# Patient Record
Sex: Female | Born: 1975 | Race: Black or African American | Hispanic: No | Marital: Single | Smoking: Former smoker
Health system: Southern US, Community
[De-identification: ages and names within clinical notes are randomized; demographics above are authoritative.]

## PROBLEM LIST (undated history)

## (undated) HISTORY — PX: OTHER SURGICAL HISTORY: SHX169

---

## 2007-01-09 ENCOUNTER — Emergency Department (HOSPITAL_COMMUNITY): Admission: EM | Admit: 2007-01-09 | Discharge: 2007-01-10 | Payer: Self-pay | Admitting: Emergency Medicine

## 2007-02-21 ENCOUNTER — Encounter (INDEPENDENT_AMBULATORY_CARE_PROVIDER_SITE_OTHER): Payer: Self-pay | Admitting: General Surgery

## 2007-02-21 ENCOUNTER — Ambulatory Visit (HOSPITAL_COMMUNITY): Admission: RE | Admit: 2007-02-21 | Discharge: 2007-02-21 | Payer: Self-pay | Admitting: General Surgery

## 2008-09-12 ENCOUNTER — Emergency Department (HOSPITAL_COMMUNITY): Admission: EM | Admit: 2008-09-12 | Discharge: 2008-09-12 | Payer: Self-pay | Admitting: Emergency Medicine

## 2008-11-27 ENCOUNTER — Emergency Department (HOSPITAL_COMMUNITY): Admission: EM | Admit: 2008-11-27 | Discharge: 2008-11-27 | Payer: Self-pay | Admitting: Emergency Medicine

## 2008-12-16 ENCOUNTER — Emergency Department (HOSPITAL_COMMUNITY): Admission: EM | Admit: 2008-12-16 | Discharge: 2008-12-16 | Payer: Self-pay | Admitting: Emergency Medicine

## 2009-11-22 ENCOUNTER — Emergency Department (HOSPITAL_COMMUNITY): Admission: EM | Admit: 2009-11-22 | Discharge: 2009-11-22 | Payer: Self-pay | Admitting: Emergency Medicine

## 2010-04-04 ENCOUNTER — Emergency Department (HOSPITAL_COMMUNITY)
Admission: EM | Admit: 2010-04-04 | Discharge: 2010-04-04 | Disposition: A | Discharge status: Home / Self Care | Payer: Self-pay | Attending: Emergency Medicine | Admitting: Emergency Medicine

## 2010-04-04 DIAGNOSIS — N949 Unspecified condition associated with female genital organs and menstrual cycle: Secondary | ICD-10-CM | POA: Insufficient documentation

## 2010-04-04 DIAGNOSIS — B9689 Other specified bacterial agents as the cause of diseases classified elsewhere: Secondary | ICD-10-CM | POA: Insufficient documentation

## 2010-04-04 DIAGNOSIS — N390 Urinary tract infection, site not specified: Secondary | ICD-10-CM | POA: Insufficient documentation

## 2010-04-04 DIAGNOSIS — A499 Bacterial infection, unspecified: Secondary | ICD-10-CM | POA: Insufficient documentation

## 2010-04-04 DIAGNOSIS — N760 Acute vaginitis: Secondary | ICD-10-CM | POA: Insufficient documentation

## 2010-04-04 LAB — POCT PREGNANCY, URINE: Preg Test, Ur: NEGATIVE

## 2010-04-04 LAB — URINALYSIS, ROUTINE W REFLEX MICROSCOPIC
Bilirubin Urine: NEGATIVE
Ketones, ur: NEGATIVE mg/dL
Nitrite: POSITIVE — AB
Protein, ur: NEGATIVE mg/dL
Urobilinogen, UA: 0.2 mg/dL (ref 0.0–1.0)

## 2010-04-04 LAB — WET PREP, GENITAL

## 2010-04-04 LAB — RPR: RPR Ser Ql: NONREACTIVE

## 2010-04-06 LAB — URINE CULTURE

## 2010-04-06 LAB — GC/CHLAMYDIA PROBE AMP, GENITAL
Chlamydia, DNA Probe: NEGATIVE
GC Probe Amp, Genital: NEGATIVE

## 2010-04-14 LAB — URINALYSIS, ROUTINE W REFLEX MICROSCOPIC
Protein, ur: 30 mg/dL — AB
Urobilinogen, UA: 0.2 mg/dL (ref 0.0–1.0)

## 2010-04-14 LAB — GC/CHLAMYDIA PROBE AMP, GENITAL: GC Probe Amp, Genital: NEGATIVE

## 2010-04-14 LAB — URINE MICROSCOPIC-ADD ON

## 2010-04-14 LAB — WET PREP, GENITAL
WBC, Wet Prep HPF POC: NONE SEEN
Yeast Wet Prep HPF POC: NONE SEEN

## 2010-05-25 NOTE — H&P (Signed)
NAME:  Jean Oneill, Jean Oneill Oneill NO.:  [id number]   MEDICAL RECORD NO.:  [id number]          PATIENT TYPE:  AMB   LOCATION:  DAY                           FACILITY:  [facility]   PHYSICIAN:  Tilford Pillar, MD      DATE OF BIRTH:  May 04, 1975   DATE OF ADMISSION:  DATE OF DISCHARGE:  LH                              HISTORY & PHYSICAL   CHIEF COMPLAINT:  Left shoulder pain.   HISTORY OF PRESENT ILLNESS:  The patient is a 35 year old female who has  had a large mass of the left shoulder and back for approximately 8  years.  This has been monitored with a slow increase in size.  Previously she had no discomfort, but as it has increased in size it is  also causing limitation of movement of her shoulder, with some  discomfort with movement and with her clothing.  She has denied any  erythema.  She denies any trauma to the area.  She has denied any weight  changes.  She has had no fever or chills.   PAST MEDICAL HISTORY:  Unremarkable.   PAST SURGICAL HISTORY:  Unremarkable.   MEDICATIONS:  None.   ALLERGIES:  No known drug allergies.   SOCIAL HISTORY:  She is a 5-6 cigarette per day smoker.  The patient  drinks alcohol mainly on weekends.  She denies any recreational drug  use.   REVIEW OF SYSTEMS:  CONSTITUTIONAL:  Occasional headaches.  EYES:  Occasional diplopia.  ENT:  Occasional rhinorrhea.  RESPIRATORY:  Occasional shortness of breath.  CARDIOVASCULAR:  Unremarkable.  GASTROINTESTINAL:  Occasional abdominal pain.  GENITOURINARY:  Unremarkable.  MUSCULOSKELETAL:  Arthralgias, mostly in this left  shoulder, as per HPI.  SKIN:  Occasional boils.  ENDOCRINE:  She  complains of excessive thirst on occasion.  NEUROLOGIC:  She denies any  decreased sensation.  She does have some occasional paresthesias.  She  also complains of intermittent episodes of dizziness.   PHYSICAL EXAMINATION:  VITAL SIGNS:  Reviewed.  GENERAL:  The patient is moderately obese, and she is not in any  acute  distress.  HEENT:  Scalp has no deformities, no masses.  Eyes:  Pupils equal,  round, and reactive.  Extraocular movements are intact.  No scleral  icterus or conjunctival pallor is noted.  No diminished hearing is  appreciated on exam.  Oral mucosa is pink, normal occlusion.  NECK:  Trachea is midline.  No cervical lymphadenopathy is appreciated.  PULMONARY:  Unlabored respirations.  She is clear to auscultation  bilaterally.  CARDIOVASCULAR:  Regular rate and rhythm.  No murmurs.  She has 2+  radial pulses bilaterally.  LYMPHATIC:  No axillary lymphadenopathy is appreciated.  ABDOMEN:  Positive bowel sounds.  Abdomen is soft, nontender.  SKIN:  Warm and dry.  She does have a large, approximately 10-12 cm soft  tissue mass involving her left shoulder and upper back.  This is soft,  mobile.  Does not feel to be attached to any of the underlying  musculature.  This is nontender to palpation, and no evidence of any  firm, irregular nodules within.   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  The risks,  benefits, and alternatives of excision were discussed with the patient.  Other possible etiologies were discussed with the patient, although  these are unlikely but would require additional surgeries should any  cancerous changes be identified within the lesion.  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  The  risks, including bleeding and infection and possible paresthesias, were  discussed with the patient.  She wishes to proceed.      Tilford Pillar, MD  Electronically Signed     BZ/MEDQ  D:  02/15/2007  T:  02/16/2007  Job:  [id number]   cc:   [facility]  Fax: [phone]

## 2010-05-25 NOTE — Op Note (Signed)
NAME:  Jean Oneill, LESMEISTER NO.:  [id number]   MEDICAL RECORD NO.:  [id number]          PATIENT TYPE:  AMB   LOCATION:  DAY                           FACILITY:  [facility]   PHYSICIAN:  Tilford Pillar, MD      DATE OF BIRTH:  01-Jul-1975   DATE OF PROCEDURE:  02/21/2007  DATE OF DISCHARGE:                               OPERATIVE REPORT   PREOPERATIVE DIAGNOSIS:  Left posterior shoulder soft tissue mass.   POSTOPERATIVE DIAGNOSIS:  Left posterior shoulder soft tissue mass.   PROCEDURE:  Excision of 15 x 20 cm lipomatous mass of the left posterior  shoulder.   SURGEON:  Tilford Pillar, M.D.   ANESTHESIA:  Laryngeal mask airway with local anesthetic.   SPECIMENS:  Lipomatous mass.   ESTIMATED BLOOD LOSS:  Minimal.   INDICATIONS FOR PROCEDURE:  The patient is a 35 year old African-  American female who presented to my office as an outpatient with a noted  mass on her left shoulder.  This had been present for approximately 10  years, had slowly increased in size, with now increasing difficulty with  movement of her left arm.  Otherwise, she had complained of no pain, no  weight loss, or weight changes, no fevers or chills, no erythematous  changes of the overlying skin.  Risks, benefits, and alternatives of  excision of the soft tissue mass suspected to be a benign lipoma was  discussed at length with the patient.  Other possibilities including  liposarcoma were discussed with the patient, although, this is very low  suspicion.  The patient's questions and concerns were addressed.  The  patient was consented for the planned procedure.   DESCRIPTION OF PROCEDURE:  The patient was taken to the operating room  and was placed in supine position on the operating table at which time  the sedation medication was administered.  At this point a large  __________  was placed and the patient was then repositioned into a  right lateral decubitus position with a beanbag for support.   Once in  position, her left shoulder was prepped and draped in the usual fashion.  A marking pen was utilized to mark the planned line of incision.  At  this point, the initial incision was made with the scalpel.  Additional  dissection down through the subcuticular tissue was carried out using  electrocautery.  A combination of electrocautery and blunt dissection  was utilized to dissect the soft tissue mass free.  This was noted to be  multilobulated, but clearly encapsulated and was relatively easy to free  from the surrounding tissue.  At this point hemostasis was obtained  using electrocautery.  At this point the soft tissue mass was delivered  through the incision and its remaining vascular pedicle was divided  using electrocautery.  This was then placed on the back table and was  sent as a permanent specimen to pathology.  Electrocautery was utilized  to obtain hemostasis in the resulting wound defect.  The wound was  irrigated with a copious amount of sterile saline.  The deep  subcuticular tissue  was then reapproximated using a 3-0 Vicryl in a  running continuous fashion.  The skin edges were reapproximated using a  4-0 Monocryl in a running subcuticular suture.  The skin was washed and  dried with a moist and dry towel.  Benzoin was applied over the incision  and half inch Steri-Strips were placed over the incisions. The drapes  were removed.  The patient was allowed to come out of general anesthesia  and was transferred to a regular hospital bed.  She was transferred to  the postanesthesia care unit in stable condition.  At the conclusion of  the procedure, all needle, sponge, and instrument counts correct.  The  patient tolerated the procedure well.      Tilford Pillar, MD  Electronically Signed     BZ/MEDQ  D:  02/21/2007  T:  02/22/2007  Job:  [id number]

## 2010-10-01 LAB — BASIC METABOLIC PANEL
CO2: 28
Calcium: 9.3
Creatinine, Ser: 0.84
GFR calc non Af Amer: >60
Glucose, Bld: 79
Sodium: 137

## 2010-10-01 LAB — CBC
Hemoglobin: 13.4
MCHC: 33.9
Platelets: 200
RDW: 14.3

## 2011-08-27 ENCOUNTER — Encounter (HOSPITAL_COMMUNITY): Payer: Self-pay | Admitting: *Deleted

## 2011-08-27 ENCOUNTER — Emergency Department (HOSPITAL_COMMUNITY): Payer: Self-pay

## 2011-08-27 ENCOUNTER — Emergency Department (HOSPITAL_COMMUNITY)
Admission: EM | Admit: 2011-08-27 | Discharge: 2011-08-27 | Disposition: A | Discharge status: Home / Self Care | Payer: Self-pay | Attending: Emergency Medicine | Admitting: Emergency Medicine

## 2011-08-27 DIAGNOSIS — N390 Urinary tract infection, site not specified: Secondary | ICD-10-CM | POA: Insufficient documentation

## 2011-08-27 DIAGNOSIS — R0789 Other chest pain: Secondary | ICD-10-CM

## 2011-08-27 DIAGNOSIS — R071 Chest pain on breathing: Secondary | ICD-10-CM | POA: Insufficient documentation

## 2011-08-27 DIAGNOSIS — R079 Chest pain, unspecified: Secondary | ICD-10-CM | POA: Insufficient documentation

## 2011-08-27 DIAGNOSIS — F17200 Nicotine dependence, unspecified, uncomplicated: Secondary | ICD-10-CM | POA: Insufficient documentation

## 2011-08-27 LAB — CBC
HCT: 41.1 % (ref 36.0–46.0)
MCHC: 33.8 g/dL (ref 30.0–36.0)
RDW: 13.2 % (ref 11.5–15.5)

## 2011-08-27 LAB — URINALYSIS, ROUTINE W REFLEX MICROSCOPIC
Ketones, ur: NEGATIVE mg/dL
Protein, ur: NEGATIVE mg/dL
Urobilinogen, UA: 0.2 mg/dL (ref 0.0–1.0)

## 2011-08-27 LAB — BASIC METABOLIC PANEL
BUN: 8 mg/dL (ref 6–23)
Creatinine, Ser: 0.92 mg/dL (ref 0.50–1.10)
GFR calc Af Amer: >90 mL/min (ref >90)
GFR calc non Af Amer: 80 mL/min — ABNORMAL LOW (ref >90)

## 2011-08-27 LAB — URINE MICROSCOPIC-ADD ON

## 2011-08-27 LAB — TROPONIN I: Troponin I: <0.3 ng/mL (ref <0.30)

## 2011-08-27 LAB — PREGNANCY, URINE: Preg Test, Ur: NEGATIVE

## 2011-08-27 MED ORDER — IBUPROFEN 400 MG PO TABS
400.0000 mg | ORAL_TABLET | Freq: Once | ORAL | Status: AC
  Administered 2011-08-27: 400 mg via ORAL
  Filled 2011-08-27: qty 1

## 2011-08-27 MED ORDER — HYDROCODONE-ACETAMINOPHEN 5-325 MG PO TABS: ORAL_TABLET | ORAL | Status: AC

## 2011-08-27 MED ORDER — NAPROXEN 250 MG PO TABS: 250.0000 mg | ORAL_TABLET | Freq: Two times a day (BID) | ORAL | Status: AC

## 2011-08-27 MED ORDER — CEPHALEXIN 500 MG PO CAPS: 500.0000 mg | ORAL_CAPSULE | Freq: Four times a day (QID) | ORAL | Status: AC

## 2011-08-27 MED ORDER — OXYCODONE-ACETAMINOPHEN 5-325 MG PO TABS
1.0000 | ORAL_TABLET | Freq: Once | ORAL | Status: AC
  Administered 2011-08-27: 1 via ORAL
  Filled 2011-08-27: qty 1

## 2011-08-27 NOTE — ED Notes (Signed)
Pt discharged. Pt stable at time of discharge. Medications reviewed pt has no questions regarding discharge at this time. Pt voiced understanding of discharge instructions.  

## 2011-08-27 NOTE — ED Notes (Signed)
Pt c/o left chest pain under her breast radiating to her back since yesterday am. Pt denies shortness of breath or dizziness. Pt denies nausea or vomiting.

## 2011-08-27 NOTE — ED Provider Notes (Signed)
History  This chart was scribed for Laray Anger, DO by Bennett Scrape. This patient was seen in room [facility].  CSN: [id number]  Arrival date & time 08/27/11  1118   First MD Initiated Contact with Patient 08/27/11 1130      Chief Complaint  Patient presents with  . Chest Pain     The history is provided by the patient. No language interpreter was used.   Pt was seen at 11:45AM. Jean Oneill is a 36 y.o. female who presents to the Emergency Department complaining of gradual onset and persistence of constant left sided chest "pain" since yesterday.  Describes the pain as a "burning" sensation located under her left breast that radiates to her back. The pain is worse with movement and palpation of the area. She denies taking OTC medications at home to improve symptoms. She denies any recent injuries. She denies fever, denies palpitations, no SOB/cough, no abd pain, no N/V/D, no rash.     History reviewed. No pertinent past medical history.  History reviewed. No pertinent past surgical history.  History  Substance Use Topics  . Smoking status: Current Everyday Smoker -- 0.5 packs/day    Types: Cigarettes  . Smokeless tobacco: Not on file  . Alcohol Use: Yes    No OB history provided.  Review of Systems ROS: Statement: All systems negative except as marked or noted in the HPI; Constitutional: Negative for fever and chills. ; ; Eyes: Negative for eye pain, redness and discharge. ; ; ENMT: Negative for ear pain, hoarseness, nasal congestion, sinus pressure and sore throat. ; ; Cardiovascular: +CP.  Negative for palpitations, diaphoresis, dyspnea and peripheral edema. ; ; Respiratory: Negative for cough, wheezing and stridor. ; ; Gastrointestinal: Negative for nausea, vomiting, diarrhea, abdominal pain, blood in stool, hematemesis, jaundice and rectal bleeding. . ; ; Genitourinary: Negative for dysuria, flank pain and hematuria. ; ; Musculoskeletal: Negative for back pain  and neck pain. Negative for swelling and trauma.; ; Skin: Negative for pruritus, rash, abrasions, blisters, bruising and skin lesion.; ; Neuro: Negative for headache, lightheadedness and neck stiffness. Negative for weakness, altered level of consciousness , altered mental status, extremity weakness, paresthesias, involuntary movement, seizure and syncope.     Allergies  Review of patient's allergies indicates no known allergies.  Home Medications  No current outpatient prescriptions on file.  Ht 5\' 1"  (1.549 m)  Wt 180 lb (81.647 kg)  BMI 34.01 kg/m2  LMP 07/30/2011  Physical Exam 1150: Physical examination:  Nursing notes reviewed; Vital signs and O2 SAT reviewed;  Constitutional: Well developed, Well nourished, Well hydrated, In no acute distress; Head:  Normocephalic, atraumatic; Eyes: EOMI, PERRL, No scleral icterus; ENMT: Mouth and pharynx normal, Mucous membranes moist; Neck: Supple, Full range of motion, No lymphadenopathy; Cardiovascular: Regular rate and rhythm, No murmur, rub, or gallop; Respiratory: Breath sounds clear & equal bilaterally, No rales, rhonchi, wheezes.  Speaking full sentences with ease, Normal respiratory effort/excursion; Chest: +TTP left lower anterior-lateral chest wall.  No rash, no ecchymosis, no soft tissue crepitus. Movement normal; Abdomen: Soft, Nontender, Nondistended, Normal bowel sounds;; Extremities: Pulses normal, No tenderness, No edema, No calf edema or asymmetry.; Neuro: AA&Ox3, Major CN grossly intact.  Speech clear. No gross focal motor or sensory deficits in extremities.; Skin: Color normal, Warm, Dry.   ED Course  Procedures   MDM  MDM Reviewed: nursing note and vitals Interpretation: labs, x-ray and ECG    Date: 08/27/2011  Rate: 56  Rhythm: normal sinus  rhythm  QRS Axis: normal  Intervals: normal  ST/T Wave abnormalities: normal  Conduction Disutrbances:none  Narrative Interpretation:   Old EKG Reviewed: none available.  Results  for orders placed during the hospital encounter of 08/27/11  TROPONIN I      Component Value Range   Troponin I <0.30  <0.30 ng/mL  D-DIMER, QUANTITATIVE      Component Value Range   D-Dimer, Quant 0.27  0.00 - 0.48 ug/mL-FEU  BASIC METABOLIC PANEL      Component Value Range   Sodium 135  135 - 145 mEq/L   Potassium 4.0  3.5 - 5.1 mEq/L   Chloride 102  96 - 112 mEq/L   CO2 27  19 - 32 mEq/L   Glucose, Bld 87  70 - 99 mg/dL   BUN 8  6 - 23 mg/dL   Creatinine, Ser 8.29  0.50 - 1.10 mg/dL   Calcium 9.7  8.4 - 56.2 mg/dL   GFR calc non Af Amer 80 (*) >90 mL/min   GFR calc Af Amer >90  >90 mL/min  CBC      Component Value Range   WBC 11.5 (*) 4.0 - 10.5 K/uL   RBC 4.37  3.87 - 5.11 MIL/uL   Hemoglobin 13.9  12.0 - 15.0 g/dL   HCT 13.0  86.5 - 78.4 %   MCV 94.1  78.0 - 100.0 fL   MCH 31.8  26.0 - 34.0 pg   MCHC 33.8  30.0 - 36.0 g/dL   RDW 69.6  29.5 - 28.4 %   Platelets 193  150 - 400 K/uL  URINALYSIS, ROUTINE W REFLEX MICROSCOPIC      Component Value Range   Color, Urine YELLOW  YELLOW   APPearance HAZY (*) CLEAR   Specific Gravity, Urine >1.030 (*) 1.005 - 1.030   pH 5.5  5.0 - 8.0   Glucose, UA NEGATIVE  NEGATIVE mg/dL   Hgb urine dipstick SMALL (*) NEGATIVE   Bilirubin Urine NEGATIVE  NEGATIVE   Ketones, ur NEGATIVE  NEGATIVE mg/dL   Protein, ur NEGATIVE  NEGATIVE mg/dL   Urobilinogen, UA 0.2  0.0 - 1.0 mg/dL   Nitrite POSITIVE (*) NEGATIVE   Leukocytes, UA NEGATIVE  NEGATIVE  PREGNANCY, URINE      Component Value Range   Preg Test, Ur NEGATIVE  NEGATIVE  URINE MICROSCOPIC-ADD ON      Component Value Range   Squamous Epithelial / LPF FEW (*) RARE   WBC, UA 3-6  <3 WBC/hpf   RBC / HPF 0-2  <3 RBC/hpf   Bacteria, UA MANY (*) RARE   Dg Chest 2 View 08/27/2011  *RADIOLOGY REPORT*  Clinical Data: Chest pain.  Back pain for 2 days.  Smoker.  CHEST - 2 VIEW  Comparison: None  Findings: The lateral view is mildly oblique. Numerous leads and wires project over the chest.   Midline trachea.  Normal heart size and mediastinal contours. No pleural effusion or pneumothorax. Clear lungs.  IMPRESSION: Normal chest.  Mildly oblique lateral view.  Original Report Authenticated By: Consuello Bossier, M.D.    1330:  Doubt ACS for cause for symptoms given normal EKG and troponin after 2 days of constant symptoms.  Doubt PE with normal d-dimer and no hypoxia, no tachypnea or tachycardia.   Appears msk pain at this time.  Will tx symptomatically.  Wants to go home now.  +UTI, will tx.  Dx testing d/w pt and family.  Questions answered.  Verb understanding,  agreeable to d/c home with outpt f/u.      I personally performed the services described in this documentation, which was scribed in my presence. The recorded information has been reviewed and considered. Ricky Doan Allison Quarry, DO 08/30/11 [phone]

## 2013-08-12 ENCOUNTER — Encounter (HOSPITAL_COMMUNITY): Payer: Self-pay | Admitting: Emergency Medicine

## 2013-08-12 ENCOUNTER — Emergency Department (HOSPITAL_COMMUNITY)
Admission: EM | Admit: 2013-08-12 | Discharge: 2013-08-12 | Disposition: A | Discharge status: Home / Self Care | Payer: Medicaid Other | Attending: Emergency Medicine | Admitting: Emergency Medicine

## 2013-08-12 DIAGNOSIS — J029 Acute pharyngitis, unspecified: Secondary | ICD-10-CM | POA: Insufficient documentation

## 2013-08-12 DIAGNOSIS — J020 Streptococcal pharyngitis: Secondary | ICD-10-CM | POA: Diagnosis not present

## 2013-08-12 DIAGNOSIS — R109 Unspecified abdominal pain: Secondary | ICD-10-CM | POA: Insufficient documentation

## 2013-08-12 DIAGNOSIS — F17200 Nicotine dependence, unspecified, uncomplicated: Secondary | ICD-10-CM | POA: Diagnosis not present

## 2013-08-12 DIAGNOSIS — Z79899 Other long term (current) drug therapy: Secondary | ICD-10-CM | POA: Diagnosis not present

## 2013-08-12 LAB — RAPID STREP SCREEN (MED CTR MEBANE ONLY): Streptococcus, Group A Screen (Direct): POSITIVE — AB

## 2013-08-12 MED ORDER — IBUPROFEN 400 MG PO TABS
400.0000 mg | ORAL_TABLET | Freq: Once | ORAL | Status: AC
  Administered 2013-08-12: 400 mg via ORAL
  Filled 2013-08-12: qty 1

## 2013-08-12 MED ORDER — PENICILLIN G BENZATHINE 1200000 UNIT/2ML IM SUSP
1.2000 10*6.[IU] | Freq: Once | INTRAMUSCULAR | Status: AC
  Administered 2013-08-12: 1.2 10*6.[IU] via INTRAMUSCULAR
  Filled 2013-08-12: qty 2

## 2013-08-12 MED ORDER — ACETAMINOPHEN 325 MG PO TABS
650.0000 mg | ORAL_TABLET | Freq: Once | ORAL | Status: AC
  Administered 2013-08-12: 650 mg via ORAL
  Filled 2013-08-12: qty 2

## 2013-08-12 MED ORDER — HYDROCODONE-ACETAMINOPHEN 7.5-325 MG/15ML PO SOLN: 15.0000 mL | Freq: Four times a day (QID) | ORAL | Status: AC | PRN

## 2013-08-12 NOTE — ED Notes (Addendum)
Patient complaining of abdominal pain and sore throat x 3 days. States "I think I have strep throat."

## 2013-08-12 NOTE — ED Provider Notes (Signed)
CSN: [id number]     Arrival date & time 08/12/13  1934 History   First MD Initiated Contact with Patient 08/12/13 2015     Chief Complaint  Patient presents with  . Abdominal Pain  . Sore Throat      HPI Pt was seen at 2045.  Per pt, c/o gradual onset and persistence of constant sore throat and subjective fevers/chills for the past 3 days. Pt describes her symptoms as "like the last time I had strep throat." Denies objective fevers, no rash, no CP/SOB, no N/V/D, no abd pain.     History reviewed. No pertinent past medical history.  History reviewed. No pertinent past surgical history.  History  Substance Use Topics  . Smoking status: Current Every Day Smoker -- 0.50 packs/day    Types: Cigarettes  . Smokeless tobacco: Not on file  . Alcohol Use: Yes     Comment: ocassionally    Review of Systems ROS: Statement: All systems negative except as marked or noted in the HPI; Constitutional: Negative for fever and +chills. ; ; Eyes: Negative for eye pain, redness and discharge. ; ; ENMT: Negative for ear pain, hoarseness, nasal congestion, sinus pressure and +sore throat. ; ; Cardiovascular: Negative for chest pain, palpitations, diaphoresis, dyspnea and peripheral edema. ; ; Respiratory: Negative for cough, wheezing and stridor. ; ; Gastrointestinal: Negative for nausea, vomiting, diarrhea, abdominal pain, blood in stool, hematemesis, jaundice and rectal bleeding. . ; ; Genitourinary: Negative for dysuria, flank pain and hematuria. ; ; Musculoskeletal: Negative for back pain and neck pain. Negative for swelling and trauma.; ; Skin: Negative for pruritus, rash, abrasions, blisters, bruising and skin lesion.; ; Neuro: Negative for headache, lightheadedness and neck stiffness. Negative for weakness, altered level of consciousness , altered mental status, extremity weakness, paresthesias, involuntary movement, seizure and syncope.        Allergies  Review of patient's allergies indicates no  known allergies.  Home Medications   Prior to Admission medications   Medication Sig Start Date End Date Taking? Authorizing Provider  ibuprofen (ADVIL,MOTRIN) 200 MG tablet Take 200 mg by mouth every 6 (six) hours as needed for fever or moderate pain.   Yes Historical Provider, MD   BP 123/73  Pulse 98  Temp(Src) 99.8 F (37.7 C) (Oral)  Resp 16  Ht 5\' 2"  (1.575 m)  Wt 183 lb (83.008 kg)  BMI 33.46 kg/m2  SpO2 100%  LMP 07/28/2013 Physical Exam 2050: Physical examination:  Nursing notes reviewed; Vital signs and O2 SAT reviewed;  Constitutional: Well developed, Well nourished, Well hydrated, In no acute distress; Head:  Normocephalic, atraumatic; Eyes: EOMI, PERRL, No scleral icterus; ENMT: TM's clear bilat. +edemetous nasal turbinates bilat with clear rhinorrhea. Mouth and pharynx without lesions. +posterior pharynx erythematous with tonsillar exudates. No intra-oral edema. No submandibular or sublingual edema. No hoarse voice, no drooling, no stridor. No pain with manipulation of larynx. No trismus. Mouth and pharynx normal, Mucous membranes moist; Neck: Supple, Full range of motion, No lymphadenopathy; Cardiovascular: Regular rate and rhythm, No murmur, rub, or gallop; Respiratory: Breath sounds clear & equal bilaterally, No rales, rhonchi, wheezes.  Speaking full sentences with ease, Normal respiratory effort/excursion; Chest: Nontender, Movement normal; Abdomen: Soft, Nontender, Nondistended, Normal bowel sounds; Genitourinary: No CVA tenderness; Extremities: Pulses normal, No tenderness, No edema, No calf edema or asymmetry.; Neuro: AA&Ox3, Major CN grossly intact.  Speech clear. No gross focal motor or sensory deficits in extremities.; Skin: Color normal, Warm, Dry.   ED Course  Procedures     MDM  MDM Reviewed: previous chart, nursing note and vitals Interpretation: labs    Results for orders placed during the hospital encounter of 08/12/13  RAPID STREP SCREEN       Result Value Ref Range   Streptococcus, Group A Screen (Direct) POSITIVE (*) NEGATIVE     2055:  Will tx with IM PCN now. Dx and testing d/w pt.  Questions answered.  Verb understanding, agreeable to d/c home with outpt f/u.       Samuel JesterKathleen Zoria Rawlinson, DO 08/15/13 [phone]

## 2013-08-12 NOTE — Discharge Instructions (Signed)
°Emergency Department Resource Guide °1) Find a Doctor and Pay Out of Pocket °Although you won't have to find out who is covered by your insurance plan, it is a good idea to ask around and get recommendations. You will then need to call the office and see if the doctor you have chosen will accept you as a new patient and what types of options they offer for patients who are self-pay. Some doctors offer discounts or will set up payment plans for their patients who do not have insurance, but you will need to ask so you aren't surprised when you get to your appointment. ° °2) Contact Your Local Health Department °Not all health departments have doctors that can see patients for sick visits, but many do, so it is worth a call to see if yours does. If you don't know where your local health department is, you can check in your phone book. The CDC also has a tool to help you locate your state's health department, and many state websites also have listings of all of their local health departments. ° °3) Find a Walk-in Clinic °If your illness is not likely to be very severe or complicated, you may want to try a walk in clinic. These are popping up all over the country in pharmacies, drugstores, and shopping centers. They're usually staffed by nurse practitioners or physician assistants that have been trained to treat common illnesses and complaints. They're usually fairly quick and inexpensive. However, if you have serious medical issues or chronic medical problems, these are probably not your best option. ° °No Primary Care Doctor: °- Call Health Connect at  832-8000 - they can help you locate a primary care doctor that  accepts your insurance, provides certain services, etc. °- Physician Referral Service- 1-800-533-3463 ° °Chronic Pain Problems: °Organization         Address  Phone   Notes  °[facility]  (336) 297-2271 Patients need to be referred by their primary care doctor.  ° °Medication  Assistance: °Organization         Address  Phone   Notes  °[place] [facility] [address] °[place], [place] [address] (336) 641-8030 --Must be a resident of [place] °-- Must have NO insurance coverage whatsoever (no Medicaid/ Medicare, etc.) °-- The pt. MUST have a primary care doctor that directs their care regularly and follows them in the community °  °[facility]  (866) 331-1348   °[employer]  (888) 892-1162   ° °Agencies that provide inexpensive medical care: °Organization         Address  Phone   Notes  °[facility]  (336) 832-8035   °[facility]    (336) 832-7272   °[facility] [address] °[place], [place] [address] (336) 832-4777   °[facility] [address], °[place] (336) 271-4999   °[facility]    (336) 373-0678   °[facility]    (336) 272-1050   °[facility] ° [address], [place] Phone:  (336) 832-4444, Fax:  (336) 832-4440 Hours of Operation:  9 am - 6 pm, M-F.  Also accepts Medicaid/Medicare and self-pay.  °[facility] ° [address], [place] Phone: (336) 832-3150, Fax: (336) [phone]. Hours of Operation:  8:30 am - 5:30 pm, M-F.  Also accepts Medicaid and self-pay.  °[facility] [place] [address], [place] Phone: (336) 878-6027   °[facility] [address], [place], [place] (336)723-1848, Ext. 123 Mondays & Thursdays: 7-9 AM.  First 15 patients are seen on a first come, first serve basis. °  ° °Medicaid-accepting [place] Providers: ° °Organization         Address  Phone   Notes  °[facility] [address], [place] (336) 641-2100 Also accepts self-pay patients.  °[facility] [address], [place] ° (336) 856-9996   °[facility] [address], [place]  (336) 288-8857   °[facility] [address] [place] [address], [place] (336) 299-7000   °Veita Bland [address], [place]  ° (336) 373-1557 Only accepts [place] Access Medicaid patients after they have their name applied to their card.  ° °Self-Pay (no insurance) in [place]: ° °Organization         Address  Phone   Notes  °Sickle Cell Patients, [facility] [address], [place] (336) 832-1970   °[facility] [address], [place] (336) 832-4400   °[facility] ° [address], [place] (336) 992-4800   °[facility]/Dr. Osei-Bonsu ° [address] [place] [address], [place] or [address], [place] (336) 841-8500 Phone number for both [place] and [place] locations is the same.  °[facility] [address], [place] (336) 299-0000   °[facility] [address] [place] [address], [place] or [address] (336) 852-7530 °(336) 878-2260   °[facility] [address], [place] (336) 350-1642, phone; (336) 294-5005, fax Sees patients 1st and 3rd Saturday of every month.  Must not qualify for public or private insurance (i.e. Medicaid, Medicare, [place] Health Choice, Veterans' Benefits) • Household income should be no more than 200% of the poverty level •The clinic cannot treat you if you are pregnant or think you are pregnant • Sexually transmitted diseases are not treated at the clinic.  ° ° °Dental Care: °Organization         Address  Phone  Notes  °[place] [facility] [address], [place] (336) 641-6152 Accepts children up to age 21 who are enrolled in Medicaid or [place] Health Choice; pregnant women with a Medicaid card; and children who have applied for Medicaid or [place] Health Choice, but were declined, whose parents can pay a reduced fee at time of service.  °[place]  [facility] [place]  [address], [place] (336) 641-7733 Accepts children up to age 21 who are enrolled in Medicaid or [place] Health Choice; pregnant women with a Medicaid card; and children who have applied for Medicaid or [place] Health Choice, but were declined, whose parents can pay a reduced fee at time of service.  °[facility] ° [address], [place] (336) 641-4533 Patients are seen by appointment only. Walk-ins are not accepted. [facility] will see patients 18 years of age and older. °Monday - Tuesday (8am-5pm) °Most Wednesdays (8:30-5pm) °$30 per visit, cash only  °[facility] ° [address], [place] (336) 641-4533 Patients are seen by appointment only. Walk-ins are not accepted. [facility] will see patients 18 years of age and older. °One   Wednesday Evening (Monthly: Volunteer Based).  $30 per visit, cash only  °[facility]  (919) 537-3737 for adults; Children under age 4, call Graduate Pediatric Dentistry at ([phone]) [phone]. Children aged 4-14, please call (919) [phone] to request a pediatric application. ° Dental services are provided in all areas of dental care including fillings, crowns and bridges, complete and partial dentures, implants, gum treatment, root canals, and extractions. Preventive care is also provided. Treatment is provided to both adults and children. °Patients are selected via a lottery and there is often a waiting list. °  °[facility] [address], °[place] ° (336) 763-8833 [email or web address] °  °[facility] [address], [place], [place] (336)723-1848, Ext. 123 Second and Fourth Thursday of each month, opens at 6:30 AM; Clinic ends at 9 AM.  Patients are seen on a first-come first-served basis, and a limited number are seen during each clinic.  ° °[facility] ° [address], [place], [place] (336) 723-7904    Eligibility Requirements °You must have lived in [place], [place], or [place] counties for at least the last three months. °  You cannot be eligible for state or federal sponsored healthcare insurance, including Veterans Administration, Medicaid, or Medicare. °  You generally cannot be eligible for healthcare insurance through your employer.  °  How to apply: °Eligibility screenings are held every Tuesday and Wednesday afternoon from 1:00 pm until 4:00 pm. You do not need an appointment for the interview!  °[facility] [address], [place], [place] 336-631-2330   °[facility]  336-342-8273   °[facility]  336-703-3100   °[facility]  336-570-6415   ° °Behavioral Health Resources in the Community: °Intensive Outpatient Programs °Organization         Address  Phone  Notes  °[facility] [address], [place], [place] 336-878-6098   °[facility] [address], [place], [place] 336-832-9800   °[facility] [address], [place], [place] ° 336-882-2125   °[facility] [address],  °[place], [place] 1-800-853-5163 or 336-641-4981   °Substance Abuse Resources °Organization         Address  Phone  Notes  °[facility]  336-882-2125   °[facility]  336-784-9470   °[facility]  336-285-9073   °[facility]  336-845-3988   °Residential & Outpatient Substance Abuse Program  1-800-659-3381   °Psychological Services °Organization         Address  Phone  Notes  °[facility]  336- 832-9600   °[facility]  336- 378-7881   °[facility] [address], [place] 1-800-853-5163 or 336-641-4981   ° °Mobile Crisis Teams °Organization         Address  Phone  Notes  °[facility], Mobile Crisis Care Unit  1-877-626-1772   °Assertive °[facility] ° [address]  [place], [place] 336-834-9664   °Sharon DeEsch [address] °[place] 336-554-5454   ° °Self-Help/Support Groups °Organization         Address  Phone             Notes  °[facility]  - variety of support groups  336- 373-1402 Call for more information  °Narcotics Anonymous (NA), [facility] [address], °[place]  2 meetings at this location  ° °  Residential Treatment Programs Organization         Address  Phone  Notes  [facility] [address],    [place]  [phone]   [facility]  [address], [place] [phone], [place], [place] [phone]   [facility] [address], [place] [phone] Admissions: 8am-3pm M-F  [facility] [address]    [place], [place] [phone]   [facility] [address], [place], [place] [phone]   [facility] [address]  [place], [place] [phone]   [facility] Intensive Outpatient [address], [place], [place] [phone]   [facility] ([facility] [address]  [place], [place] [phone]) [phone] or [phone]   [facility] ([facility] [address] [place], [place] [phone] Accepts Medicaid  [facility] [address]  [place] [phone] Substance Abuse/Addiction Treatment   [facility]         Address  Phone  Notes  [facility]  [phone](888) (847) [phone]   Angie FavaJulie Brannon, PhD [address], [address] [place], [place]   [phone](336) [phone] or [phone](336) [phone]   [facility]   [address] [place], [place] [phone](336) [phone]   [facility] [address], [place], [place] [phone]) [phone] Insurance/Medicaid/sponsorship through [facility]  [facility] [address]                                    [place], [place] [phone]) [phone] Therapy/tele-psych/case    [facility] [address]   [place], [place] [phone]) [phone]    Dr. Lolly MustacheArfeen  [phone](336) [phone]   [facility]  [employer] [facility] 1) [address], [place] 2) [address], [place] 3)  [address], [place] [phone](336) [phone](336) [phone]  [phone](336) [phone]   [facility] Child Abuse Hotline 564-235-1861(336) (445)284-6104 or [phone](336) 6676956549 (After Hours)       Take the prescription as directed. Also you can take over the counter ibuprofen, as directed on packaging, as needed for discomfort.  Gargle with warm water several times per day to help with discomfort.  May also use over the counter sore throat pain medicines such as chloraseptic or sucrets, as directed on packaging, as needed for discomfort.  Call your regular medical doctor tomorrow to schedule a follow up appointment in the next 2 days.  Return to the Emergency Department immediately if worsening.

## 2014-05-26 ENCOUNTER — Emergency Department (HOSPITAL_COMMUNITY): Payer: Medicaid Other

## 2014-05-26 ENCOUNTER — Emergency Department (HOSPITAL_COMMUNITY)
Admission: EM | Admit: 2014-05-26 | Discharge: 2014-05-26 | Disposition: A | Discharge status: Home / Self Care | Payer: Medicaid Other | Attending: Emergency Medicine | Admitting: Emergency Medicine

## 2014-05-26 ENCOUNTER — Encounter (HOSPITAL_COMMUNITY): Payer: Self-pay | Admitting: Emergency Medicine

## 2014-05-26 DIAGNOSIS — J019 Acute sinusitis, unspecified: Secondary | ICD-10-CM | POA: Diagnosis not present

## 2014-05-26 DIAGNOSIS — H578 Other specified disorders of eye and adnexa: Secondary | ICD-10-CM | POA: Diagnosis not present

## 2014-05-26 DIAGNOSIS — Z720 Tobacco use: Secondary | ICD-10-CM | POA: Diagnosis not present

## 2014-05-26 DIAGNOSIS — R0981 Nasal congestion: Secondary | ICD-10-CM | POA: Diagnosis present

## 2014-05-26 MED ORDER — PSEUDOEPHEDRINE HCL 60 MG PO TABS: 60.0000 mg | ORAL_TABLET | Freq: Four times a day (QID) | ORAL | Status: DC | PRN

## 2014-05-26 MED ORDER — AMOXICILLIN 500 MG PO CAPS: 500.0000 mg | ORAL_CAPSULE | Freq: Three times a day (TID) | ORAL | Status: AC

## 2014-05-26 MED ORDER — ALBUTEROL SULFATE HFA 108 (90 BASE) MCG/ACT IN AERS: 1.0000 | INHALATION_SPRAY | Freq: Four times a day (QID) | RESPIRATORY_TRACT | Status: AC | PRN

## 2014-05-26 NOTE — ED Provider Notes (Signed)
CSN: [id number]     Arrival date & time 05/26/14  1559 History  This chart was scribed for Burgess AmorJulie Rashad Auld, PA-C, working with No att. providers found by Leona CarryG. Clay Sherrill, ED Scribe. The patient was seen in APRP/RP. The patient's care was started at 6:14 PM.     Chief Complaint  Patient presents with  . Nasal Congestion   HPI HPI Comments: Jean Oneill is a [age] y.o. female with a history of smoking who presents to the Emergency Department complaining of nasal congestion and rhinorrhea beginning five days ago.  Her congestion has been thick and yellow to green in coloration.  She denies epistaxis.  Patient reports an associated fever last night that peaked at 101. She also complains of an associated cough that is productive of yellow and green sputum.  Patient reports pressure around her left eye and states that she has experienced tearing from this eye.  She denies ear pain or SOB.  She reports that one of her family members experienced similar symptoms and was seen in the ED last week.    History reviewed. No pertinent past medical history. History reviewed. No pertinent past surgical history. History reviewed. No pertinent family history. History  Substance Use Topics  . Smoking status: Current Every Day Smoker -- 0.50 packs/day    Types: Cigars  . Smokeless tobacco: Not on file  . Alcohol Use: Yes     Comment: ocassionally   OB History    No data available     Review of Systems  Constitutional: Positive for fever.  HENT: Positive for congestion and rhinorrhea. Negative for ear pain.   Eyes: Positive for pain and discharge.  Respiratory: Positive for cough. Negative for shortness of breath.       Allergies  Review of patient's allergies indicates no known allergies.  Home Medications   Prior to Admission medications   Medication Sig Start Date End Date Taking? Authorizing Provider  albuterol (PROVENTIL HFA;VENTOLIN HFA) 108 (90 BASE) MCG/ACT inhaler Inhale 1-2 puffs into the  lungs every 6 (six) hours as needed for wheezing or shortness of breath. 05/26/14   Burgess AmorJulie Imunique Samad, PA-C  amoxicillin (AMOXIL) 500 MG capsule Take 1 capsule (500 mg total) by mouth 3 (three) times daily. 05/26/14 06/05/14  Burgess AmorJulie Treanna Dumler, PA-C  HYDROcodone-acetaminophen (HYCET) 7.5-325 mg/15 ml solution Take 15 mLs by mouth every 6 (six) hours as needed for moderate pain. 08/12/13 08/12/14  Samuel JesterKathleen McManus, DO  ibuprofen (ADVIL,MOTRIN) 200 MG tablet Take 200 mg by mouth every 6 (six) hours as needed for fever or moderate pain.    Historical Provider, MD  pseudoephedrine (SUDAFED) 60 MG tablet Take 1 tablet (60 mg total) by mouth every 6 (six) hours as needed for congestion. 05/26/14   Burgess AmorJulie Lydiann Bonifas, PA-C   Triage Vitals: BP 125/88 mmHg  Pulse 72  Temp(Src) 98.7 F (37.1 C) (Oral)  Resp 20  Ht 5\' 2"  (1.575 m)  Wt 179 lb (81.194 kg)  BMI 32.73 kg/m2  SpO2 100%  LMP 05/12/2014 Physical Exam  Constitutional: She is oriented to person, place, and time. She appears well-developed and well-nourished.  HENT:  Head: Normocephalic and atraumatic.  Right Ear: Tympanic membrane and ear canal normal.  Left Ear: Tympanic membrane and ear canal normal.  Nose: Mucosal edema and rhinorrhea present. No epistaxis. Left sinus exhibits frontal sinus tenderness.  Mouth/Throat: Uvula is midline, oropharynx is clear and moist and mucous membranes are normal. No oropharyngeal exudate, posterior oropharyngeal edema, posterior oropharyngeal erythema or  tonsillar abscesses.  Eyes: Conjunctivae are normal.  Cardiovascular: Normal rate and normal heart sounds.   Pulmonary/Chest: Effort normal. No respiratory distress. She has no decreased breath sounds. She has wheezes. She has no rhonchi. She has no rales.  Expiratory wheeze right mid lung field  Abdominal: Soft. There is no tenderness.  Musculoskeletal: Normal range of motion.  Neurological: She is alert and oriented to person, place, and time.  Skin: Skin is warm and dry. No rash  noted.  Psychiatric: She has a normal mood and affect.  Nursing note and vitals reviewed.   ED Course  Procedures (including critical care time) DIAGNOSTIC STUDIES: Oxygen Saturation is 100% on room air, normal by my interpretation.    COORDINATION OF CARE:    Labs Review Labs Reviewed - No data to display  Imaging Review Dg Chest 2 View  05/26/2014   CLINICAL DATA:  Nasal congestion. Productive cough. Symptoms for 5 days. Short of breath.  EXAM: CHEST  2 VIEW  COMPARISON:  08/27/2011.  FINDINGS: Cardiopericardial silhouette within normal limits. Mediastinal contours normal. Trachea midline. No airspace disease or effusion.  IMPRESSION: No active cardiopulmonary disease.   Electronically Signed   By: Andreas NewportGeoffrey  Lamke M.D.   On: 05/26/2014 18:37     EKG Interpretation None      MDM   Final diagnoses:  Acute sinusitis, recurrence not specified, unspecified location    Patients labs and/or radiological studies were reviewed and considered during the medical decision making and disposition process.  Results were also discussed with patient. Pt was placed on amoxil, sudafed for sinusitis, albuterol with spacer for cough and mild wheeze.  cxray reviewed and clear.  Hx and exam c/w probable viral uri with resultant sinusitis.  The patient appears reasonably screened and/or stabilized for discharge and I doubt any other medical condition or other [facility] requiring further screening, evaluation, or treatment in the ED at this time prior to discharge.   I personally performed the services described in this documentation, which was scribed in my presence. The recorded information has been reviewed and is accurate.    Burgess AmorJulie Joseandres Mazer, PA-C 05/27/14 1354  Nelva Nayobert Beaton, MD 05/29/14 [phone]

## 2014-05-26 NOTE — ED Notes (Signed)
Pt states that she has had nasal congestion, itchy, watery eyes, and felt like she had a fever.

## 2014-05-26 NOTE — Discharge Instructions (Signed)
Sinusitis °Sinusitis is redness, soreness, and puffiness (inflammation) of the air pockets in the bones of your face (sinuses). The redness, soreness, and puffiness can cause air and mucus to get trapped in your sinuses. This can allow germs to grow and cause an infection.  °HOME CARE  °· Drink enough fluids to keep your pee (urine) clear or pale yellow. °· Use a humidifier in your home. °· Run a hot shower to create steam in the bathroom. Sit in the bathroom with the door closed. Breathe in the steam 3-4 times a day. °· Put a warm, moist washcloth on your face 3-4 times a day, or as told by your doctor. °· Use salt water sprays (saline sprays) to wet the thick fluid in your nose. This can help the sinuses drain. °· Only take medicine as told by your doctor. °GET HELP RIGHT AWAY IF:  °· Your pain gets worse. °· You have very bad headaches. °· You are sick to your stomach (nauseous). °· You throw up (vomit). °· You are very sleepy (drowsy) all the time. °· Your face is puffy (swollen). °· Your vision changes. °· You have a stiff neck. °· You have trouble breathing. °MAKE SURE YOU:  °· Understand these instructions. °· Will watch your condition. °· Will get help right away if you are not doing well or get worse. °Document Released: 06/15/2007 Document Revised: 09/21/2011 Document Reviewed: 08/02/2011 °ExitCare® Patient Information ©2015 [employer]. This information is not intended to replace advice given to you by your health care provider. Make sure you discuss any questions you have with your health care provider. ° °

## 2015-11-23 ENCOUNTER — Emergency Department (HOSPITAL_COMMUNITY): Payer: Medicaid Other

## 2015-11-23 ENCOUNTER — Encounter (HOSPITAL_COMMUNITY): Payer: Self-pay | Admitting: Emergency Medicine

## 2015-11-23 ENCOUNTER — Emergency Department (HOSPITAL_COMMUNITY)
Admission: EM | Admit: 2015-11-23 | Discharge: 2015-11-23 | Disposition: A | Discharge status: Home / Self Care | Payer: Medicaid Other | Attending: Emergency Medicine | Admitting: Emergency Medicine

## 2015-11-23 DIAGNOSIS — Z792 Long term (current) use of antibiotics: Secondary | ICD-10-CM | POA: Insufficient documentation

## 2015-11-23 DIAGNOSIS — Z791 Long term (current) use of non-steroidal anti-inflammatories (NSAID): Secondary | ICD-10-CM | POA: Diagnosis not present

## 2015-11-23 DIAGNOSIS — R109 Unspecified abdominal pain: Secondary | ICD-10-CM | POA: Diagnosis present

## 2015-11-23 DIAGNOSIS — N390 Urinary tract infection, site not specified: Secondary | ICD-10-CM

## 2015-11-23 DIAGNOSIS — R10A0 Flank pain, unspecified side: Secondary | ICD-10-CM

## 2015-11-23 DIAGNOSIS — F17290 Nicotine dependence, other tobacco product, uncomplicated: Secondary | ICD-10-CM | POA: Insufficient documentation

## 2015-11-23 DIAGNOSIS — Z79899 Other long term (current) drug therapy: Secondary | ICD-10-CM | POA: Diagnosis not present

## 2015-11-23 LAB — CBC WITH DIFFERENTIAL/PLATELET
Basophils Absolute: 0 10*3/uL (ref 0.0–0.1)
Basophils Relative: 0 %
EOS ABS: 0.2 10*3/uL (ref 0.0–0.7)
Eosinophils Relative: 1 %
HEMATOCRIT: 40.6 % (ref 36.0–46.0)
HEMOGLOBIN: 13.9 g/dL (ref 12.0–15.0)
LYMPHS ABS: 4.6 10*3/uL — AB (ref 0.7–4.0)
Lymphocytes Relative: 38 %
MCH: 32.5 pg (ref 26.0–34.0)
MCHC: 34.2 g/dL (ref 30.0–36.0)
MCV: 94.9 fL (ref 78.0–100.0)
MONOS PCT: 7 %
Monocytes Absolute: 0.9 10*3/uL (ref 0.1–1.0)
NEUTROS ABS: 6.6 10*3/uL (ref 1.7–7.7)
NEUTROS PCT: 54 %
Platelets: 190 10*3/uL (ref 150–400)
RBC: 4.28 MIL/uL (ref 3.87–5.11)
RDW: 13.4 % (ref 11.5–15.5)
WBC: 12.3 10*3/uL — ABNORMAL HIGH (ref 4.0–10.5)

## 2015-11-23 LAB — URINE MICROSCOPIC-ADD ON

## 2015-11-23 LAB — COMPREHENSIVE METABOLIC PANEL
ALBUMIN: 4.2 g/dL (ref 3.5–5.0)
ALK PHOS: 68 U/L (ref 38–126)
ALT: 21 U/L (ref 14–54)
ANION GAP: 8 (ref 5–15)
AST: 16 U/L (ref 15–41)
BUN: 12 mg/dL (ref 6–20)
CALCIUM: 9.6 mg/dL (ref 8.9–10.3)
CO2: 27 mmol/L (ref 22–32)
Chloride: 106 mmol/L (ref 101–111)
Creatinine, Ser: 1.12 mg/dL — ABNORMAL HIGH (ref 0.44–1.00)
GFR calc Af Amer: >60 mL/min (ref >60)
GFR calc non Af Amer: >60 mL/min (ref >60)
GLUCOSE: 81 mg/dL (ref 65–99)
Potassium: 3.4 mmol/L — ABNORMAL LOW (ref 3.5–5.1)
SODIUM: 141 mmol/L (ref 135–145)
Total Bilirubin: 0.4 mg/dL (ref 0.3–1.2)
Total Protein: 8 g/dL (ref 6.5–8.1)

## 2015-11-23 LAB — URINALYSIS, ROUTINE W REFLEX MICROSCOPIC
BILIRUBIN URINE: NEGATIVE
Glucose, UA: NEGATIVE mg/dL
Ketones, ur: NEGATIVE mg/dL
Leukocytes, UA: NEGATIVE
Nitrite: POSITIVE — AB
PH: 6 (ref 5.0–8.0)
Protein, ur: 30 mg/dL — AB
SPECIFIC GRAVITY, URINE: 1.025 (ref 1.005–1.030)

## 2015-11-23 LAB — POC URINE PREG, ED: Preg Test, Ur: NEGATIVE

## 2015-11-23 LAB — LIPASE, BLOOD: Lipase: 24 U/L (ref 11–51)

## 2015-11-23 MED ORDER — ONDANSETRON HCL 4 MG/2ML IJ SOLN
4.0000 mg | Freq: Once | INTRAMUSCULAR | Status: AC
  Administered 2015-11-23: 4 mg via INTRAVENOUS
  Filled 2015-11-23: qty 2

## 2015-11-23 MED ORDER — CYCLOBENZAPRINE HCL 10 MG PO TABS: 10.0000 mg | ORAL_TABLET | Freq: Two times a day (BID) | ORAL | 0 refills | Status: DC | PRN

## 2015-11-23 MED ORDER — HYDROMORPHONE HCL 1 MG/ML IJ SOLN
0.5000 mg | INTRAMUSCULAR | Status: DC | PRN
  Administered 2015-11-23: 0.5 mg via INTRAVENOUS
  Filled 2015-11-23: qty 1

## 2015-11-23 MED ORDER — SODIUM CHLORIDE 0.9 % IV BOLUS (SEPSIS)
1000.0000 mL | Freq: Once | INTRAVENOUS | Status: AC
  Administered 2015-11-23: 1000 mL via INTRAVENOUS

## 2015-11-23 MED ORDER — SODIUM CHLORIDE 0.9 % IV SOLN
INTRAVENOUS | Status: DC
  Administered 2015-11-23: 20:00:00 via INTRAVENOUS

## 2015-11-23 MED ORDER — CEPHALEXIN 250 MG PO CAPS: 250.0000 mg | ORAL_CAPSULE | Freq: Four times a day (QID) | ORAL | 0 refills | Status: DC

## 2015-11-23 MED ORDER — CEPHALEXIN 500 MG PO CAPS
500.0000 mg | ORAL_CAPSULE | Freq: Once | ORAL | Status: AC
  Administered 2015-11-23: 500 mg via ORAL
  Filled 2015-11-23: qty 1

## 2015-11-23 MED ORDER — HYDROCODONE-ACETAMINOPHEN 5-325 MG PO TABS: 1.0000 | ORAL_TABLET | ORAL | 0 refills | Status: DC | PRN

## 2015-11-23 NOTE — ED Notes (Signed)
IV removed from right [facility] intact. Pt tolerated well. Skin clean and dry

## 2015-11-23 NOTE — ED Triage Notes (Signed)
Pt reports L flank pain with radiation into her R groin that started on Friday. Pt denies hematuria, frequency or burning. Pt denies n/v.

## 2015-11-23 NOTE — Discharge Instructions (Signed)
Follow up with a primary care doctor to be rechecked later this week, return for fever, worsening symptoms

## 2015-11-23 NOTE — ED Provider Notes (Signed)
[facility]-EMER[facility]Y DEPT Provider Note   CSN: [id number] Arrival date & time: 11/23/15  1717     History   Chief Complaint Chief Complaint  Patient presents with  . Flank Pain    HPI Jean Oneill is a 40 y.o. female.  HPI Patient presents to the emergency room with complaints of left-sided flank pain. Symptoms started the end of last week. Patient has noticed that movement and certain positions make it worse. She has been having difficulty lying on her left side because of the pain. Today the symptoms have become more severe and she is very uncomfortable. The pain is sharp severe in the left flank and radiates towards the front of her abdomen. The pain is bringing her to tears. She denies any trouble with nausea vomiting. No dysuria. No diarrhea or constipation. No fevers chills coughing or shortness of breath. History reviewed. No pertinent past medical history.  There are no active problems to display for this patient.   Past Surgical History:  Procedure Laterality Date  . lipoma removal      OB History    Gravida Para Term Preterm AB Living   6 4 4   1 4    SAB TAB Ectopic Multiple Live Births   1               Home Medications    Prior to Admission medications   Medication Sig Start Date End Date Taking? Authorizing Provider  albuterol (PROVENTIL HFA;VENTOLIN HFA) 108 (90 BASE) MCG/ACT inhaler Inhale 1-2 puffs into the lungs every 6 (six) hours as needed for wheezing or shortness of breath. 05/26/14  Yes Raynelle FanningJulie Idol, PA-C  amoxicillin (AMOXIL) 500 MG capsule Take 500 mg by mouth every 8 (eight) hours. 8 day course starting on 11/20/2015   Yes Historical Provider, MD  ibuprofen (ADVIL,MOTRIN) 800 MG tablet Take 800 mg by mouth every 8 (eight) hours as needed for mild pain or moderate pain.   Yes Historical Provider, MD  cephALEXin (KEFLEX) 250 MG capsule Take 1 capsule (250 mg total) by mouth 4 (four) times daily. 11/23/15   Linwood DibblesJon Ninfa Giannelli, MD  cyclobenzaprine (FLEXERIL) 10  MG tablet Take 1 tablet (10 mg total) by mouth 2 (two) times daily as needed for muscle spasms. 11/23/15   Linwood DibblesJon Eyonna Sandstrom, MD  HYDROcodone-acetaminophen (NORCO/VICODIN) 5-325 MG tablet Take 1 tablet by mouth every 4 (four) hours as needed. 11/23/15   Linwood DibblesJon Alisse Tuite, MD    Family History Family History  Problem Relation Age of Onset  . Cancer Father   . Cancer Mother   . Hypertension Mother   . Diabetes Mother     Social History Social History  Substance Use Topics  . Smoking status: Current Every Day Smoker    Packs/day: 0.50    Types: Cigars  . Smokeless tobacco: Never Used  . Alcohol use Yes     Comment: ocassionally     Allergies   Patient has no known allergies.   Review of Systems Review of Systems  All other systems reviewed and are negative.    Physical Exam Updated Vital Signs BP 109/67   Pulse 65   Temp 98.1 F (36.7 C) (Oral)   Resp 20   Ht 5\' 1"  (1.549 m)   Wt 81.2 kg   LMP 10/30/2015   SpO2 100%   BMI 33.82 kg/m   Physical Exam  Constitutional: She appears well-developed and well-nourished. No distress.  HENT:  Head: Normocephalic and atraumatic.  Right Ear: External ear  normal.  Left Ear: External ear normal.  Eyes: Conjunctivae are normal. Right eye exhibits no discharge. Left eye exhibits no discharge. No scleral icterus.  Neck: Neck supple. No tracheal deviation present.  Cardiovascular: Normal rate, regular rhythm and intact distal pulses.   Pulmonary/Chest: Effort normal and breath sounds normal. No stridor. No respiratory distress. She has no wheezes. She has no rales.  Abdominal: Soft. Bowel sounds are normal. She exhibits no distension and no mass. There is tenderness. There is CVA tenderness (Left-sided). There is no rigidity, no rebound and no guarding. No hernia.  Musculoskeletal: She exhibits no edema or tenderness.  Neurological: She is alert. She has normal strength. No cranial nerve deficit (no facial droop, extraocular movements intact,  no slurred speech) or sensory deficit. She exhibits normal muscle tone. She displays no seizure activity. Coordination normal.  Skin: Skin is warm and dry. No rash noted.  Psychiatric: She has a normal mood and affect.  Nursing note and vitals reviewed.    ED Treatments / Results  Labs (all labs ordered are listed, but only abnormal results are displayed) Labs Reviewed  COMPREHENSIVE METABOLIC PANEL - Abnormal; Notable for the following:       Result Value   Potassium 3.4 (*)    Creatinine, Ser 1.12 (*)    All other components within normal limits  CBC WITH DIFFERENTIAL/PLATELET - Abnormal; Notable for the following:    WBC 12.3 (*)    Lymphs Abs 4.6 (*)    All other components within normal limits  URINALYSIS, ROUTINE W REFLEX MICROSCOPIC (NOT AT [facility]) - Abnormal; Notable for the following:    APPearance HAZY (*)    Hgb urine dipstick SMALL (*)    Protein, ur 30 (*)    Nitrite POSITIVE (*)    All other components within normal limits  URINE MICROSCOPIC-ADD ON - Abnormal; Notable for the following:    Squamous Epithelial / LPF 6-30 (*)    Bacteria, UA MANY (*)    All other components within normal limits  LIPASE, BLOOD  POC URINE PREG, ED    EKG  EKG Interpretation None       Radiology Ct Abdomen Pelvis Wo Contrast  Result Date: 11/23/2015 CLINICAL DATA:  Left flank pain radiating into the left groin since 11/20/2015. EXAM: CT ABDOMEN AND PELVIS WITHOUT CONTRAST TECHNIQUE: Multidetector CT imaging of the abdomen and pelvis was performed following the standard protocol without IV contrast. COMPARISON:  None. FINDINGS: Lower chest: Clear. No pleural or pericardial effusion. Heart size is normal. Hepatobiliary: Negative. Pancreas: Negative. Spleen: Negative. Adrenals/Urinary Tract: Negative. No urinary tract stones or hydronephrosis. No focal lesion is identified. Stomach/Bowel: Stomach is within normal limits. Appendix appears normal. No evidence of bowel wall thickening,  distention, or inflammatory changes. Vascular/Lymphatic: Scattered iliac artery atherosclerotic calcifications are identified and worse on the right. Reproductive: Simple appearing right ovarian cyst measuring 2.9 cm in diameter is incidentally noted. Otherwise unremarkable. Other: None. Musculoskeletal: Negative. IMPRESSION: No acute abnormality or finding to explain the patient's symptoms. Negative for urinary tract stone. Atherosclerosis. Electronically Signed   By: Drusilla Kannerhomas  Dalessio M.D.   On: 11/23/2015 20:38    Procedures Procedures (including critical care time)  Medications Ordered in ED Medications  sodium chloride 0.9 % bolus 1,000 mL (0 mLs Intravenous Stopped 11/23/15 2001)    And  0.9 %  sodium chloride infusion ( Intravenous New Bag/Given 11/23/15 2001)  HYDROmorphone (DILAUDID) injection 0.5 mg (0.5 mg Intravenous Given 11/23/15 1834)  cephALEXin (KEFLEX) capsule  500 mg (not administered)  ondansetron (ZOFRAN) injection 4 mg (4 mg Intravenous Given 11/23/15 1834)     Initial Impression / Assessment and Plan / ED Course  I have reviewed the triage vital signs and the nursing notes.  Pertinent labs & imaging results that were available during my care of the patient were reviewed by me and considered in my medical decision making (see chart for details).  Clinical Course    Labs show increased wbc and possible uti although no urinary symptoms.  CT scan without acute findings.   I suspect her pain is most likely musculoskeletal but I cannot completely exclude pyelonephritis because of the abnormal urinalysis I will start her on a course of oral antibiotics.  I discussed close outpatient follow-up with a primary care doctor. Return as needed for worsening symptoms, fever, vomiting  Final Clinical Impressions(s) / ED Diagnoses   Final diagnoses:  Flank pain  Urinary tract infection without hematuria, site unspecified    New Prescriptions New Prescriptions   CEPHALEXIN  (KEFLEX) 250 MG CAPSULE    Take 1 capsule (250 mg total) by mouth 4 (four) times daily.   CYCLOBENZAPRINE (FLEXERIL) 10 MG TABLET    Take 1 tablet (10 mg total) by mouth 2 (two) times daily as needed for muscle spasms.   HYDROCODONE-ACETAMINOPHEN (NORCO/VICODIN) 5-325 MG TABLET    Take 1 tablet by mouth every 4 (four) hours as needed.     Linwood Dibbles, MD 11/23/15 2124

## 2015-11-23 NOTE — ED Notes (Signed)
Pt reports malodorous urine X2 months, flank pain started Friday. Denies N/V/D, hematuria, or dysuria.

## 2015-12-26 ENCOUNTER — Emergency Department (HOSPITAL_COMMUNITY)
Admission: EM | Admit: 2015-12-26 | Discharge: 2015-12-26 | Disposition: A | Discharge status: Home / Self Care | Payer: Medicaid Other | Attending: Emergency Medicine | Admitting: Emergency Medicine

## 2015-12-26 ENCOUNTER — Encounter (HOSPITAL_COMMUNITY): Payer: Self-pay | Admitting: Emergency Medicine

## 2015-12-26 DIAGNOSIS — R197 Diarrhea, unspecified: Secondary | ICD-10-CM | POA: Diagnosis not present

## 2015-12-26 DIAGNOSIS — J3489 Other specified disorders of nose and nasal sinuses: Secondary | ICD-10-CM | POA: Insufficient documentation

## 2015-12-26 DIAGNOSIS — R51 Headache: Secondary | ICD-10-CM | POA: Diagnosis not present

## 2015-12-26 DIAGNOSIS — N390 Urinary tract infection, site not specified: Secondary | ICD-10-CM | POA: Diagnosis not present

## 2015-12-26 DIAGNOSIS — F17290 Nicotine dependence, other tobacco product, uncomplicated: Secondary | ICD-10-CM | POA: Insufficient documentation

## 2015-12-26 DIAGNOSIS — Z791 Long term (current) use of non-steroidal anti-inflammatories (NSAID): Secondary | ICD-10-CM | POA: Insufficient documentation

## 2015-12-26 DIAGNOSIS — R112 Nausea with vomiting, unspecified: Secondary | ICD-10-CM

## 2015-12-26 LAB — URINALYSIS, ROUTINE W REFLEX MICROSCOPIC
BILIRUBIN URINE: NEGATIVE
Glucose, UA: NEGATIVE mg/dL
Ketones, ur: 20 mg/dL — AB
LEUKOCYTES UA: NEGATIVE
NITRITE: POSITIVE — AB
PROTEIN: 30 mg/dL — AB
Specific Gravity, Urine: 1.027 (ref 1.005–1.030)
pH: 5 (ref 5.0–8.0)

## 2015-12-26 LAB — COMPREHENSIVE METABOLIC PANEL
ALK PHOS: 85 U/L (ref 38–126)
ALT: 34 U/L (ref 14–54)
ANION GAP: 9 (ref 5–15)
AST: 19 U/L (ref 15–41)
Albumin: 4.4 g/dL (ref 3.5–5.0)
BILIRUBIN TOTAL: 0.5 mg/dL (ref 0.3–1.2)
BUN: 10 mg/dL (ref 6–20)
CO2: 26 mmol/L (ref 22–32)
Calcium: 9.5 mg/dL (ref 8.9–10.3)
Chloride: 103 mmol/L (ref 101–111)
Creatinine, Ser: 0.83 mg/dL (ref 0.44–1.00)
GFR calc non Af Amer: >60 mL/min (ref >60)
Glucose, Bld: 87 mg/dL (ref 65–99)
Potassium: 3.6 mmol/L (ref 3.5–5.1)
SODIUM: 138 mmol/L (ref 135–145)
TOTAL PROTEIN: 8.5 g/dL — AB (ref 6.5–8.1)

## 2015-12-26 LAB — CBC
HCT: 44.8 % (ref 36.0–46.0)
HEMOGLOBIN: 14.9 g/dL (ref 12.0–15.0)
MCH: 31.8 pg (ref 26.0–34.0)
MCHC: 33.3 g/dL (ref 30.0–36.0)
MCV: 95.7 fL (ref 78.0–100.0)
Platelets: 194 10*3/uL (ref 150–400)
RBC: 4.68 MIL/uL (ref 3.87–5.11)
RDW: 13.3 % (ref 11.5–15.5)
WBC: 9.8 10*3/uL (ref 4.0–10.5)

## 2015-12-26 LAB — LIPASE, BLOOD: Lipase: 22 U/L (ref 11–51)

## 2015-12-26 LAB — PREGNANCY, URINE: Preg Test, Ur: NEGATIVE

## 2015-12-26 MED ORDER — ONDANSETRON HCL 4 MG PO TABS: 4.0000 mg | ORAL_TABLET | Freq: Three times a day (TID) | ORAL | 0 refills | Status: AC | PRN

## 2015-12-26 MED ORDER — SODIUM CHLORIDE 0.9 % IV BOLUS (SEPSIS)
1000.0000 mL | Freq: Once | INTRAVENOUS | Status: AC
  Administered 2015-12-26: 1000 mL via INTRAVENOUS

## 2015-12-26 MED ORDER — NITROFURANTOIN MONOHYD MACRO 100 MG PO CAPS: 100.0000 mg | ORAL_CAPSULE | Freq: Two times a day (BID) | ORAL | 0 refills | Status: DC

## 2015-12-26 MED ORDER — ONDANSETRON HCL 4 MG/2ML IJ SOLN
4.0000 mg | INTRAMUSCULAR | Status: DC | PRN
  Administered 2015-12-26: 4 mg via INTRAVENOUS
  Filled 2015-12-26: qty 2

## 2015-12-26 NOTE — ED Notes (Signed)
Pt has consumed ginger ale and did not have any emesis or nausea.  Urine specimen obtained.

## 2015-12-26 NOTE — ED Notes (Signed)
Pt states understanding of care given and follow up instructions.  Ambulated from ED with SO

## 2015-12-26 NOTE — ED Triage Notes (Signed)
Pt reports /n/v/d and headache that started yesterday.

## 2015-12-26 NOTE — ED Notes (Signed)
Pt states that she was exposed to her boyfriend and Victory DakinRiley who have had the same symptoms of n/v/d.

## 2015-12-26 NOTE — ED Provider Notes (Signed)
[facility] DE[facility] Provider Note   CSN: [id number] Arrival date & time: 12/26/15  1409     History   Chief Complaint Chief Complaint  Patient presents with  . Emesis    HPI Jean Oneill is a 40 y.o. female.   Emesis    Pt was seen at 1720. Per pt, c/o gradual onset and persistence of multiple intermittent episodes of N/V/D that began overnight last night.   Describes the stools as "watery." Has been associated with frontal headache, sinus and nasal congestion. Multiple others in household with similar symptoms. Denies abd pain, no CP/SOB, no back pain, no fevers, no black or blood in stools or emesis.    History reviewed. No pertinent past medical history.  There are no active problems to display for this patient.   Past Surgical History:  Procedure Laterality Date  . lipoma removal      OB History    Gravida Para Term Preterm AB Living   6 4 4   1 4    SAB TAB Ectopic Multiple Live Births   1               Home Medications    Prior to Admission medications   Medication Sig Start Date End Date Taking? Authorizing Provider  albuterol (PROVENTIL HFA;VENTOLIN HFA) 108 (90 BASE) MCG/ACT inhaler Inhale 1-2 puffs into the lungs every 6 (six) hours as needed for wheezing or shortness of breath. 05/26/14   Burgess AmorJulie Idol, PA-C  amoxicillin (AMOXIL) 500 MG capsule Take 500 mg by mouth every 8 (eight) hours. 8 day course starting on 11/20/2015    Historical Provider, MD  cephALEXin (KEFLEX) 250 MG capsule Take 1 capsule (250 mg total) by mouth 4 (four) times daily. 11/23/15   Linwood DibblesJon Knapp, MD  cyclobenzaprine (FLEXERIL) 10 MG tablet Take 1 tablet (10 mg total) by mouth 2 (two) times daily as needed for muscle spasms. 11/23/15   Linwood DibblesJon Knapp, MD  HYDROcodone-acetaminophen (NORCO/VICODIN) 5-325 MG tablet Take 1 tablet by mouth every 4 (four) hours as needed. 11/23/15   Linwood DibblesJon Knapp, MD  ibuprofen (ADVIL,MOTRIN) 800 MG tablet Take 800 mg by mouth every 8 (eight) hours as needed for mild  pain or moderate pain.    Historical Provider, MD    Family History Family History  Problem Relation Age of Onset  . Cancer Father   . Cancer Mother   . Hypertension Mother   . Diabetes Mother     Social History Social History  Substance Use Topics  . Smoking status: Current Every Day Smoker    Packs/day: 0.50    Types: Cigars  . Smokeless tobacco: Never Used  . Alcohol use Yes     Comment: ocassionally     Allergies   Patient has no known allergies.   Review of Systems Review of Systems  Gastrointestinal: Positive for vomiting.  ROS: Statement: All systems negative except as marked or noted in the HPI; Constitutional: Negative for fever and chills. ; ; Eyes: Negative for eye pain, redness and discharge. ; ; ENMT: Negative for ear pain, hoarseness, sore throat. +frontal headache, nasal congestion, sinus pressure. ; ; Cardiovascular: Negative for chest pain, palpitations, diaphoresis, dyspnea and peripheral edema. ; ; Respiratory: Negative for cough, wheezing and stridor. ; ; Gastrointestinal: +N/V/D. Negative for abdominal pain, blood in stool, hematemesis, jaundice and rectal bleeding. . ; ; Genitourinary: Negative for dysuria, flank pain and hematuria. ; ; Musculoskeletal: Negative for back pain and neck pain. Negative for swelling and  trauma.; ; Skin: Negative for pruritus, rash, abrasions, blisters, bruising and skin lesion.; ; Neuro: Negative for lightheadedness and neck stiffness. Negative for weakness, altered level of consciousness, altered mental status, extremity weakness, paresthesias, involuntary movement, seizure and syncope.      Physical Exam Updated Vital Signs BP 108/78 (BP Location: Right Leg)   Pulse 69   Temp 98.7 F (37.1 C) (Oral)   Resp 18   Ht 5' (1.524 m)   Wt 179 lb (81.2 kg)   LMP 12/22/2015   SpO2 100%   BMI 34.96 kg/m   Physical Exam 1725; Physical examination:  Nursing notes reviewed; Vital signs and O2 SAT reviewed;  Constitutional:  Well developed, Well nourished, Well hydrated, In no acute distress; Head:  Normocephalic, atraumatic; Eyes: EOMI, PERRL, No scleral icterus; ENMT: TM's clear bilat. +edemetous nasal turbinates bilat with clear rhinorrhea. Mouth and pharynx without lesions. No tonsillar exudates. No intra-oral edema. No submandibular or sublingual edema. No hoarse voice, no drooling, no stridor. No pain with manipulation of larynx. No trismus. Mouth and pharynx normal, Mucous membranes moist; Neck: Supple, Full range of motion, No lymphadenopathy; Cardiovascular: Regular rate and rhythm, No murmur, rub, or gallop; Respiratory: Breath sounds clear & equal bilaterally, No rales, rhonchi, wheezes.  Speaking full sentences with ease, Normal respiratory effort/excursion; Chest: Nontender, Movement normal; Abdomen: Soft, Nontender, Nondistended, Normal bowel sounds; Genitourinary: No CVA tenderness; Extremities: Pulses normal, No tenderness, No edema, No calf edema or asymmetry.; Neuro: AA&Ox3, Major CN grossly intact.  Speech clear. No gross focal motor or sensory deficits in extremities.; Skin: Color normal, Warm, Dry.   ED Treatments / Results  Labs (all labs ordered are listed, but only abnormal results are displayed)   EKG  EKG Interpretation None       Radiology   Procedures Procedures (including critical care time)  Medications Ordered in ED Medications  ondansetron (ZOFRAN) injection 4 mg (4 mg Intravenous Given 12/26/15 1743)  sodium chloride 0.9 % bolus 1,000 mL (1,000 mLs Intravenous New Bag/Given 12/26/15 1743)     Initial Impression / Assessment and Plan / ED Course  I have reviewed the triage vital signs and the nursing notes.  Pertinent labs & imaging results that were available during my care of the patient were reviewed by me and considered in my medical decision making (see chart for details).  MDM Reviewed: previous chart, nursing note and vitals Reviewed previous:  labs Interpretation: labs   Results for orders placed or performed during the hospital encounter of 12/26/15  Lipase, blood  Result Value Ref Range   Lipase 22 11 - 51 U/L  Comprehensive metabolic panel  Result Value Ref Range   Sodium 138 135 - 145 mmol/L   Potassium 3.6 3.5 - 5.1 mmol/L   Chloride 103 101 - 111 mmol/L   CO2 26 22 - 32 mmol/L   Glucose, Bld 87 65 - 99 mg/dL   BUN 10 6 - 20 mg/dL   Creatinine, Ser 2.440.83 0.44 - 1.00 mg/dL   Calcium 9.5 8.9 - 01.010.3 mg/dL   Total Protein 8.5 (H) 6.5 - 8.1 g/dL   Albumin 4.4 3.5 - 5.0 g/dL   AST 19 15 - 41 U/L   ALT 34 14 - 54 U/L   Alkaline Phosphatase 85 38 - 126 U/L   Total Bilirubin 0.5 0.3 - 1.2 mg/dL   GFR calc non Af Amer >60 >60 mL/min   GFR calc Af Amer >60 >60 mL/min   Anion gap 9 5 -  15  CBC  Result Value Ref Range   WBC 9.8 4.0 - 10.5 K/uL   RBC 4.68 3.87 - 5.11 MIL/uL   Hemoglobin 14.9 12.0 - 15.0 g/dL   HCT 40.9 81.1 - 91.4 %   MCV 95.7 78.0 - 100.0 fL   MCH 31.8 26.0 - 34.0 pg   MCHC 33.3 30.0 - 36.0 g/dL   RDW 78.2 95.6 - 21.3 %   Platelets 194 150 - 400 K/uL  Urinalysis, Routine w reflex microscopic  Result Value Ref Range   Color, Urine AMBER (A) YELLOW   APPearance HAZY (A) CLEAR   Specific Gravity, Urine 1.027 1.005 - 1.030   pH 5.0 5.0 - 8.0   Glucose, UA NEGATIVE NEGATIVE mg/dL   Hgb urine dipstick MODERATE (A) NEGATIVE   Bilirubin Urine NEGATIVE NEGATIVE   Ketones, ur 20 (A) NEGATIVE mg/dL   Protein, ur 30 (A) NEGATIVE mg/dL   Nitrite POSITIVE (A) NEGATIVE   Leukocytes, UA NEGATIVE NEGATIVE   RBC / HPF 6-30 0 - 5 RBC/hpf   WBC, UA 0-5 0 - 5 WBC/hpf   Bacteria, UA MANY (A) NONE SEEN   Mucous PRESENT   Pregnancy, urine  Result Value Ref Range   Preg Test, Ur NEGATIVE NEGATIVE    1915:  Pt has tol PO well while in the ED without N/V.  No stooling while in the ED.  Abd remains benign, VSS. Feels better and wants to go home now. Pt states she was tx for UTI last month (keflex). Will obtain UC  today. Dx and testing d/w pt.  Questions answered.  Verb understanding, agreeable to d/c home with outpt f/u.     Final Clinical Impressions(s) / ED Diagnoses   Final diagnoses:  None    New Prescriptions New Prescriptions   No medications on file      Samuel Jester, DO 12/30/15 1550

## 2015-12-26 NOTE — Discharge Instructions (Signed)
Take the prescriptions as directed.  Increase your fluid intake (ie:  Gatoraide) for the next few days, as discussed.  Eat a bland diet and advance to your regular diet slowly as you can tolerate it.   Avoid full strength juices, as well as milk and milk products until your diarrhea has resolved.  Call your regular medical doctor Monday to schedule a follow up appointment this week.  Return to the Emergency Department immediately if not improving (or even worsening) despite taking the medicines as prescribed, any black or bloody stool or vomit, or for any other concerns. ° °

## 2015-12-30 LAB — URINE CULTURE

## 2015-12-31 ENCOUNTER — Telehealth (HOSPITAL_BASED_OUTPATIENT_CLINIC_OR_DEPARTMENT_OTHER): Payer: Self-pay

## 2015-12-31 NOTE — Progress Notes (Signed)
ED Antimicrobial Stewardship Positive Culture Follow Up   Elwanda Brooklynonya D Pullara is an 40 y.o. female who [facility] with a chief complaint of  Chief Complaint  Patient presents with  . Emesis    Recent Results (from the past 720 hour(s))  Urine culture     Status: Abnormal   Collection Time: 12/26/15  6:40 PM  Result Value Ref Range Status   Specimen Description URINE, RANDOM  Final   Special Requests NONE  Final   Culture >=100,000 COLONIES/mL ESCHERICHIA COLI (A)  Final   Report Status 12/30/2015 FINAL  Final   Organism ID, Bacteria ESCHERICHIA COLI (A)  Final      Susceptibility   Escherichia coli - MIC*    AMPICILLIN >=32 RESISTANT Resistant     CEFAZOLIN <=4 SENSITIVE Sensitive     CEFTRIAXONE <=1 SENSITIVE Sensitive     CIPROFLOXACIN <=0.25 SENSITIVE Sensitive     GENTAMICIN 2 SENSITIVE Sensitive     IMIPENEM 0.5 SENSITIVE Sensitive     NITROFURANTOIN 64 INTERMEDIATE Intermediate     TRIMETH/SULFA <=20 SENSITIVE Sensitive     AMPICILLIN/SULBACTAM >=32 RESISTANT Resistant     PIP/TAZO <=4 SENSITIVE Sensitive     Extended ESBL NEGATIVE Sensitive     * >=100,000 COLONIES/mL ESCHERICHIA COLI    [x]  Treated with macrobid, organism intermediate to prescribed antimicrobial []  Patient discharged originally without antimicrobial agent and treatment is now indicated  New antibiotic prescription: keflex 250 mg bid x 1 week  ED Provider: Shanna CiscoJamie Ward, PA-C  Bertram MillardMichael A Cathleen Yagi 12/31/2015, 8:30 AM Infectious Diseases Pharmacist Phone# [phone]

## 2015-12-31 NOTE — Telephone Encounter (Signed)
Post ED Visit - Positive Culture Follow-up: Successful Patient Follow-Up  Culture assessed and recommendations reviewed by: []  Enzo BiNathan Batchelder, Pharm.D. []  Celedonio MiyamotoJeremy Frens, Pharm.D., BCPS [x]  Garvin FilaMike Maccia, Pharm.D. []  Georgina PillionElizabeth Martin, Pharm.D., BCPS []  HonorMinh Pham, [address]Pharm[address]D., BCPS, AAHIVP []  Estella HuskMichelle Turner, Pharm.D., BCPS, AAHIVP []  Tennis Mustassie Stewart, Pharm.D. []  Rob Oswaldo DoneVincent, [address]Pharm[address]  Positive urine culture, >/= 100,000 colonies -> E Coli  []  Patient discharged without antimicrobial prescription and treatment is now indicated [x]  Organism is intermediate to prescribed ED discharge antimicrobial []  Patient with positive blood cultures  Changes discussed with ED provider: Elizabeth SauerJaime Ward PA New antibiotic prescription "D/C Macrobid, Keflex 250 mg BID x week" Called to   Contacted patient, date 12/31/15, time 1307  LVM requesting callback.  [facility]     Arvid RightClark, Aylyn Wenzler Dorn 12/31/2015, 1:01 PM

## 2016-01-09 ENCOUNTER — Telehealth: Payer: Self-pay

## 2016-01-09 NOTE — Telephone Encounter (Signed)
Post ED Visit - Positive Culture Follow-up: Successful Patient Follow-Up  Culture assessed and recommendations reviewed by: []  Enzo BiNathan Batchelder, Pharm.D. []  Celedonio MiyamotoJeremy Frens, Pharm.D., BCPS []  Garvin FilaMike Maccia, Pharm.D. []  Georgina PillionElizabeth Martin, Pharm.D., BCPS []  Pymatuning SouthMinh Pham, [address]Pharm.D., BCPS, AAHIVP []  Estella HuskMichelle Turner, Pharm.D., BCPS, AAHIVP []  Tennis Mustassie Stewart, Pharm.D. []  Sherle Poeob Vincent, [address]Ph[address]  Positive urine culture  []  Patient discharged without antimicrobial prescription and treatment is now indicated [x]  Organism is resistant to prescribed ED discharge antimicrobial []  Patient with positive blood cultures  Changes discussed with ED provider: [facility]ime Ward [facility] New [facility] prescription Keflex 250 mg BID x 1 week [facility]  [phone]  Contacted patient, date 01/09/16, time 1625   Jerry CarasCullom, Milliani Herrada Burnett 01/09/2016, 4:24 PM

## 2016-10-13 ENCOUNTER — Emergency Department (HOSPITAL_COMMUNITY)
Admission: EM | Admit: 2016-10-13 | Discharge: 2016-10-13 | Disposition: A | Discharge status: Home / Self Care | Payer: Self-pay | Attending: Emergency Medicine | Admitting: Emergency Medicine

## 2016-10-13 ENCOUNTER — Encounter (HOSPITAL_COMMUNITY): Payer: Self-pay

## 2016-10-13 DIAGNOSIS — F17290 Nicotine dependence, other tobacco product, uncomplicated: Secondary | ICD-10-CM | POA: Insufficient documentation

## 2016-10-13 DIAGNOSIS — N760 Acute vaginitis: Secondary | ICD-10-CM | POA: Insufficient documentation

## 2016-10-13 DIAGNOSIS — Z79899 Other long term (current) drug therapy: Secondary | ICD-10-CM | POA: Insufficient documentation

## 2016-10-13 DIAGNOSIS — B9689 Other specified bacterial agents as the cause of diseases classified elsewhere: Secondary | ICD-10-CM | POA: Insufficient documentation

## 2016-10-13 LAB — URINALYSIS, ROUTINE W REFLEX MICROSCOPIC
BILIRUBIN URINE: NEGATIVE
GLUCOSE, UA: NEGATIVE mg/dL
Ketones, ur: 20 mg/dL — AB
Leukocytes, UA: NEGATIVE
NITRITE: POSITIVE — AB
PH: 5 (ref 5.0–8.0)
Protein, ur: 30 mg/dL — AB
SPECIFIC GRAVITY, URINE: 1.027 (ref 1.005–1.030)

## 2016-10-13 LAB — WET PREP, GENITAL
SPERM: NONE SEEN
TRICH WET PREP: NONE SEEN
YEAST WET PREP: NONE SEEN

## 2016-10-13 LAB — PREGNANCY, URINE: Preg Test, Ur: NEGATIVE

## 2016-10-13 MED ORDER — METRONIDAZOLE 500 MG PO TABS: 500.0000 mg | ORAL_TABLET | Freq: Two times a day (BID) | ORAL | 0 refills | Status: DC

## 2016-10-13 NOTE — Discharge Instructions (Signed)
Flagyl as prescribed.  We will call you if your cultures indicate your require further treatment or action.

## 2016-10-13 NOTE — ED Triage Notes (Signed)
Pt reports left flank pain and white vaginal discharge x 1 month, seen and treated here for same a month ago.

## 2016-10-13 NOTE — ED Provider Notes (Signed)
[facility]-EMERGENCY DEPT Provider Note   CSN: [id number] Arrival date & time: 10/13/16  0508     History   Chief Complaint Chief Complaint  Patient presents with  . Abdominal Pain    HPI Jean Oneill is a 41 y.o. female.  Patient is a 41 year old female who presents with complaints of vaginal discharge and lower abdominal cramping for the past month. She denies any fevers or chills. No urinary complaints. She is sexually active with the same partner for the past 2 years. Last menstrual period was this month, and she actually reports having "two periods". No bowel or bladder complaints.   The history is provided by the patient.  Abdominal Pain   This is a new problem. Episode onset:  one month ago. The problem occurs constantly. The problem has been gradually worsening. The pain is located in the suprapubic region. The quality of the pain is cramping. The pain is moderate. Pertinent negatives include fever. Nothing aggravates the symptoms. Nothing relieves the symptoms.    History reviewed. No pertinent past medical history.  There are no active problems to display for this patient.   Past Surgical History:  Procedure Laterality Date  . lipoma removal      OB History    Gravida Para Term Preterm AB Living   SAB TAB Ectopic Multiple Live Births   1               Home Medications    Prior to Admission medications   Medication Sig Start Date End Date Taking? Authorizing Provider  albuterol (PROVENTIL HFA;VENTOLIN HFA) 108 (90 BASE) MCG/ACT inhaler Inhale 1-2 puffs into the lungs every 6 (six) hours as needed for wheezing or shortness of breath. 05/26/14   Burgess Amor, PA-C  ibuprofen (ADVIL,MOTRIN) 800 MG tablet Take 800 mg by mouth every 8 (eight) hours as needed for mild pain or moderate pain.    [provider]  nitrofurantoin, macrocrystal-monohydrate, (MACROBID) 100 MG capsule Take 1 capsule (100 mg total) by mouth 2 (two) times daily. 12/26/15    Samuel Jester, DO  ondansetron (ZOFRAN) 4 MG tablet Take 1 tablet (4 mg total) by mouth every 8 (eight) hours as needed for nausea or vomiting. 12/26/15   Samuel Jester, DO    Family History Family History  Problem Relation Age of Onset  . Cancer Father   . Cancer Mother   . Hypertension Mother   . Diabetes Mother     Social History Social History  Substance Use Topics  . Smoking status: Current Every Day Smoker    Packs/day: 0.50    Types: Cigars  . Smokeless tobacco: Never Used  . Alcohol use Yes     Comment: ocassionally     Allergies   Patient has no known allergies.   Review of Systems Review of Systems  Constitutional: Negative for fever.  Gastrointestinal: Positive for abdominal pain.  All other systems reviewed and are negative.    Physical Exam Updated Vital Signs BP 125/82 (BP Location: Left Arm)   Pulse 90   Temp 98.4 F (36.9 C) (Oral)   Resp 16   Ht  (1.549 m)   Wt 79.4 kg (175 lb)   LMP 10/05/2016 (Approximate)   SpO2 100%   BMI 33.07 kg/m   Physical Exam  Constitutional: She is oriented to person, place, and time. She appears well-developed and well-nourished. No distress.  HENT:  Head: Normocephalic  and atraumatic.  Neck: Normal range of motion. Neck supple.  Cardiovascular: Normal rate and regular rhythm.  Exam reveals no gallop and no friction rub.   No murmur heard. Pulmonary/Chest: Effort normal and breath sounds normal. No respiratory distress. She has no wheezes.  Abdominal: Soft. Bowel sounds are normal. She exhibits no distension. There is no tenderness.  Genitourinary:  Genitourinary Comments:  The exterior genitalia appears normal. There is a slight whitish discharge present. There are no adnexal masses and no CMT  Musculoskeletal: Normal range of motion.  Neurological: She is alert and oriented to person, place, and time.  Skin: Skin is warm and dry. She is not diaphoretic.  Nursing note and vitals  reviewed.    ED Treatments / Results  Labs (all labs ordered are listed, but only abnormal results are displayed) Labs Reviewed  WET PREP, GENITAL  URINALYSIS, ROUTINE W REFLEX MICROSCOPIC  PREGNANCY, URINE  GC/CHLAMYDIA PROBE AMP ([place]) NOT AT [facility]    EKG  EKG Interpretation None       Radiology No results found.  Procedures Procedures (including critical care time)  Medications Ordered in ED Medications - No data to display   Initial Impression / Assessment and Plan / ED Course  I have reviewed the triage vital signs and the nursing notes.  Pertinent labs & imaging results that were available during my care of the patient were reviewed by me and considered in my medical decision making (see chart for details).  Clue cells on wet prep. Will treat for BV with flagyl. GC and Chlamydia pending.  Final Clinical Impressions(s) / ED Diagnoses   Final diagnoses:  None    New Prescriptions New Prescriptions   No medications on file     Geoffery Lyons, MD 10/13/16 0630

## 2016-10-14 LAB — GC/CHLAMYDIA PROBE AMP (~~LOC~~) NOT AT ARMC
Chlamydia: NEGATIVE
Neisseria Gonorrhea: NEGATIVE

## 2017-03-18 ENCOUNTER — Other Ambulatory Visit: Payer: Self-pay

## 2017-03-18 ENCOUNTER — Emergency Department (HOSPITAL_COMMUNITY): Payer: Medicaid Other

## 2017-03-18 ENCOUNTER — Emergency Department (HOSPITAL_COMMUNITY)
Admission: EM | Admit: 2017-03-18 | Discharge: 2017-03-18 | Disposition: A | Discharge status: Home / Self Care | Payer: Medicaid Other | Attending: Emergency Medicine | Admitting: Emergency Medicine

## 2017-03-18 ENCOUNTER — Encounter (HOSPITAL_COMMUNITY): Payer: Self-pay | Admitting: Emergency Medicine

## 2017-03-18 DIAGNOSIS — H53132 Sudden visual loss, left eye: Secondary | ICD-10-CM | POA: Diagnosis not present

## 2017-03-18 DIAGNOSIS — F17290 Nicotine dependence, other tobacco product, uncomplicated: Secondary | ICD-10-CM | POA: Insufficient documentation

## 2017-03-18 DIAGNOSIS — G43B0 Ophthalmoplegic migraine, not intractable: Secondary | ICD-10-CM | POA: Diagnosis not present

## 2017-03-18 DIAGNOSIS — H547 Unspecified visual loss: Secondary | ICD-10-CM | POA: Diagnosis present

## 2017-03-18 DIAGNOSIS — Z79899 Other long term (current) drug therapy: Secondary | ICD-10-CM | POA: Diagnosis not present

## 2017-03-18 LAB — CBC
HEMATOCRIT: 43.7 % (ref 36.0–46.0)
HEMOGLOBIN: 14.6 g/dL (ref 12.0–15.0)
MCH: 32.1 pg (ref 26.0–34.0)
MCHC: 33.4 g/dL (ref 30.0–36.0)
MCV: 96 fL (ref 78.0–100.0)
Platelets: 230 10*3/uL (ref 150–400)
RBC: 4.55 MIL/uL (ref 3.87–5.11)
RDW: 12.9 % (ref 11.5–15.5)
WBC: 12.3 10*3/uL — AB (ref 4.0–10.5)

## 2017-03-18 LAB — COMPREHENSIVE METABOLIC PANEL
ALBUMIN: 4.2 g/dL (ref 3.5–5.0)
ALT: 12 U/L — AB (ref 14–54)
AST: 16 U/L (ref 15–41)
Alkaline Phosphatase: 77 U/L (ref 38–126)
Anion gap: 10 (ref 5–15)
BUN: 11 mg/dL (ref 6–20)
CHLORIDE: 104 mmol/L (ref 101–111)
CO2: 24 mmol/L (ref 22–32)
Calcium: 9.7 mg/dL (ref 8.9–10.3)
Creatinine, Ser: 0.86 mg/dL (ref 0.44–1.00)
GFR calc Af Amer: >60 mL/min (ref >60)
GFR calc non Af Amer: >60 mL/min (ref >60)
GLUCOSE: 99 mg/dL (ref 65–99)
Potassium: 3.4 mmol/L — ABNORMAL LOW (ref 3.5–5.1)
SODIUM: 138 mmol/L (ref 135–145)
Total Bilirubin: 0.4 mg/dL (ref 0.3–1.2)
Total Protein: 8.2 g/dL — ABNORMAL HIGH (ref 6.5–8.1)

## 2017-03-18 LAB — I-STAT CHEM 8, ED
BUN: 9 mg/dL (ref 6–20)
CHLORIDE: 102 mmol/L (ref 101–111)
CREATININE: 0.8 mg/dL (ref 0.44–1.00)
Calcium, Ion: 1.22 mmol/L (ref 1.15–1.40)
GLUCOSE: 91 mg/dL (ref 65–99)
HEMATOCRIT: 46 % (ref 36.0–46.0)
HEMOGLOBIN: 15.6 g/dL — AB (ref 12.0–15.0)
POTASSIUM: 3.4 mmol/L — AB (ref 3.5–5.1)
Sodium: 141 mmol/L (ref 135–145)
TCO2: 25 mmol/L (ref 22–32)

## 2017-03-18 LAB — I-STAT BETA HCG BLOOD, ED (MC, WL, AP ONLY): I-stat hCG, quantitative: <5 m[IU]/mL (ref <5)

## 2017-03-18 LAB — DIFFERENTIAL
BASOS ABS: 0 10*3/uL (ref 0.0–0.1)
BASOS PCT: 0 %
EOS ABS: 0.2 10*3/uL (ref 0.0–0.7)
Eosinophils Relative: 1 %
Lymphocytes Relative: 32 %
Lymphs Abs: 4 10*3/uL (ref 0.7–4.0)
MONOS PCT: 6 %
Monocytes Absolute: 0.8 10*3/uL (ref 0.1–1.0)
NEUTROS ABS: 7.4 10*3/uL (ref 1.7–7.7)
NEUTROS PCT: 61 %

## 2017-03-18 LAB — I-STAT TROPONIN, ED: TROPONIN I, POC: 0 ng/mL (ref 0.00–0.08)

## 2017-03-18 LAB — PROTIME-INR
INR: 0.96
Prothrombin Time: 12.7 seconds (ref 11.4–15.2)

## 2017-03-18 LAB — APTT: APTT: 31 s (ref 24–36)

## 2017-03-18 MED ORDER — LORAZEPAM 2 MG/ML IJ SOLN
0.5000 mg | Freq: Once | INTRAMUSCULAR | Status: AC
  Administered 2017-03-18: 0.5 mg via INTRAVENOUS
  Filled 2017-03-18: qty 1

## 2017-03-18 MED ORDER — POTASSIUM CHLORIDE CRYS ER 20 MEQ PO TBCR
40.0000 meq | EXTENDED_RELEASE_TABLET | Freq: Once | ORAL | Status: AC
  Administered 2017-03-18: 40 meq via ORAL
  Filled 2017-03-18: qty 2

## 2017-03-18 MED ORDER — KETOROLAC TROMETHAMINE 30 MG/ML IJ SOLN
30.0000 mg | Freq: Once | INTRAMUSCULAR | Status: AC
  Administered 2017-03-18: 30 mg via INTRAVENOUS
  Filled 2017-03-18: qty 1

## 2017-03-18 MED ORDER — PROCHLORPERAZINE EDISYLATE 5 MG/ML IJ SOLN
10.0000 mg | Freq: Once | INTRAMUSCULAR | Status: AC
  Administered 2017-03-18: 10 mg via INTRAVENOUS
  Filled 2017-03-18: qty 2

## 2017-03-18 MED ORDER — GADOBENATE DIMEGLUMINE 529 MG/ML IV SOLN
20.0000 mL | Freq: Once | INTRAVENOUS | Status: AC | PRN
  Administered 2017-03-18: 16 mL via INTRAVENOUS

## 2017-03-18 NOTE — ED Notes (Addendum)
Tele Neuro MD assessment in progress at this time. EDP at bedside during exam.

## 2017-03-18 NOTE — Progress Notes (Signed)
Code STroke Phone call [phone] Exam started approximately 12[facility] ended 1227 Exam completed in epic 1230[facility] spoke with Kennyth ArnoldStacy

## 2017-03-18 NOTE — Consult Note (Signed)
   [facility] TeleNeurology Consult Services  Impression:  Patient with self-report left monocular inferior altitudinal vision loss.  Her exam is normal.  Monocular altitudinal change more consistent with retinal/ocular pathology.  No cerebral signs/symptoms present.    Not a tpa candidate due to: presentation not consistent with cerebral ischemia Not an NIR candidate due to: presentation not suggestive of LVO/cererbal ischemia   Differential Diagnosis:   1. Retinal/ocular pathology 2. Optic neuropathy  Comments:   Door time: 1218 [facility] contacted: 1237 [facility] at bedside: 1242 NIHSS assessment time: 1245  Recommendations:  MRI brain/orbits CTA head/neck Ophtho eval inpatient neurology consultation Inpatient stroke evaluation as per Neurology/ Internal Medicine Discussed with ED MD  -----------------------------------------------------------------------------------------  CC: stroke alert  History of Present Illness  Patient is a 42 year old woman with a history of smoking who presented with left monocular vision loss.  Patient has been feeling dizzy and having left eye irritation/itching/watering over last 3 days.  Today around noon she lost vision in the lower half of the left eye only.  No pain in the eye.  No weakness, sensory changes, speech/language change, gait change, HA, CP.  Diagnostic: CT head wo - nothing acute  Exam: NIHSS score: 0 1a LOC: 0  1b Questions: 0 1c Commands: 0 2 Gaze: 0 3 VF: 0 4 Face: 0  5a Motor arm left: 0  5b Motor arm right: 0  6a Motor leg left: 0  6b Motor leg right: 0 7 Ataxia: 0  8 Sensory: 0  9: Language: 0  10: Speech: 0  11: Extinction: 0       Medical Decision Making:  - Extensive number of diagnosis or management options are considered above.   - Extensive amount of complex data reviewed.   - High risk of complication and/or morbidity or mortality are associated with differential diagnostic  considerations above.  - There may be Uncertain outcome and increased probability of prolonged functional impairment or high probability of severe prolonged functional impairment associated with some of these differential diagnosis.   Medical Data Reviewed:  1.Data reviewed include clinical labs, radiology,  Medical Tests;   2.Tests results discussed w/performing or interpreting physician;   3.Obtaining/reviewing old medical records;  4.Obtaining case history from another source;  5.Independent review of image, tracing or specimen.    Patient was informed the Neurology Consult would happen via telehealth (remote video) and consented to receiving care in this manner.

## 2017-03-18 NOTE — ED Notes (Signed)
Patient transported to MRI 

## 2017-03-18 NOTE — ED Provider Notes (Signed)
[facility] EMERGENCY DEPARTMENT Provider Note   CSN: [id number] Arrival date & time: 03/18/17  1218   An emergency department physician performed an initial assessment on this suspected stroke patient at 1225.  History   Chief Complaint Chief Complaint  Patient presents with  . Code Stroke    HPI Jean Oneill is a 42 y.o. female.  HPI Patient presents with acute onset hemianopsia. She is here with her fianc. Assists with the HPI. She notes that she was in her usual state of health until just prior to ED arrival, approximately 30 minutes prior to my evaluation. About that time she noticed sudden onset loss of vision in the upper portion of her left eye. She perhaps had some clear discharge prior to the event, but notes no other notable/memorable changes in eye health, no history of prior similar vision loss. She denies any medical problems, states that she is generally well. Since onset symptoms been persistent. Patient was designated a code stroke soon after arrival. History reviewed. No pertinent past medical history.  There are no active problems to display for this patient.   Past Surgical History:  Procedure Laterality Date  . lipoma removal      OB History    Gravida Para Term Preterm AB Living   6 4 4   1 4    SAB TAB Ectopic Multiple Live Births   1               Home Medications    Prior to Admission medications   Medication Sig Start Date End Date Taking? Authorizing Provider  albuterol (PROVENTIL HFA;VENTOLIN HFA) 108 (90 BASE) MCG/ACT inhaler Inhale 1-2 puffs into the lungs every 6 (six) hours as needed for wheezing or shortness of breath. 05/26/14   Burgess AmorIdol, Julie, PA-C  ibuprofen (ADVIL,MOTRIN) 800 MG tablet Take 800 mg by mouth every 8 (eight) hours as needed for mild pain or moderate pain.    [provider]  metroNIDAZOLE (FLAGYL) 500 MG tablet Take 1 tablet (500 mg total) by mouth 2 (two) times daily. One po bid x 7 days 10/13/16   Geoffery Lyonselo,  Douglas, MD  nitrofurantoin, macrocrystal-monohydrate, (MACROBID) 100 MG capsule Take 1 capsule (100 mg total) by mouth 2 (two) times daily. 12/26/15   Samuel JesterMcManus, Kathleen, DO  ondansetron (ZOFRAN) 4 MG tablet Take 1 tablet (4 mg total) by mouth every 8 (eight) hours as needed for nausea or vomiting. 12/26/15   Samuel JesterMcManus, Kathleen, DO    Family History Family History  Problem Relation Age of Onset  . Cancer Father   . Cancer Mother   . Hypertension Mother   . Diabetes Mother     Social History Social History   Tobacco Use  . Smoking status: Current Every Day Smoker    Packs/day: 0.50    Types: Cigars  . Smokeless tobacco: Never Used  Substance Use Topics  . Alcohol use: Yes    Comment: ocassionally  . Drug use: Yes    Types: Marijuana    Comment: last weekend     Allergies   Patient has no known allergies.   Review of Systems Review of Systems  Constitutional:       Per HPI, otherwise negative  HENT:       Per HPI, otherwise negative  Eyes: Positive for visual disturbance.  Respiratory:       Per HPI, otherwise negative  Cardiovascular:       Per HPI, otherwise negative  Gastrointestinal: Negative for vomiting.  Endocrine:       Negative aside from HPI  Genitourinary:       Neg aside from HPI   Musculoskeletal:       Per HPI, otherwise negative  Skin: Negative.   Neurological: Negative for tremors, seizures, syncope, facial asymmetry, speech difficulty, weakness, light-headedness, numbness and headaches.     Physical Exam Updated Vital Signs BP 106/69   Pulse 79   Temp 98.7 F (37.1 C) (Oral)   Resp (!) 21   Ht 5' (1.524 m)   Wt 81.6 kg (180 lb)   LMP 03/16/2017   SpO2 99%   BMI 35.15 kg/m   Physical Exam  Constitutional: She is oriented to person, place, and time. She appears well-developed and well-nourished. No distress.  HENT:  Head: Normocephalic and atraumatic.  Eyes: Conjunctivae and EOM are normal. Pupils are equal, round, and reactive  to light. Right eye exhibits no discharge. Left eye exhibits no discharge.  Cardiovascular: Normal rate and regular rhythm.  Pulmonary/Chest: Effort normal and breath sounds normal. No stridor. No respiratory distress.  Abdominal: She exhibits no distension.  Musculoskeletal: She exhibits no edema.  Neurological: She is alert and oriented to person, place, and time. She displays no atrophy and no tremor. No cranial nerve deficit. She exhibits normal muscle tone. She displays no seizure activity. Coordination normal.  Loss of L superior visual field, otherwise nml neuro / opthalmologic exam  Skin: Skin is warm and dry.  Psychiatric: She has a normal mood and affect.  Nursing note and vitals reviewed.    ED Treatments / Results  Labs (all labs ordered are listed, but only abnormal results are displayed) Labs Reviewed  CBC - Abnormal; Notable for the following components:      Result Value   WBC 12.3 (*)    All other components within normal limits  COMPREHENSIVE METABOLIC PANEL - Abnormal; Notable for the following components:   Potassium 3.4 (*)    Total Protein 8.2 (*)    ALT 12 (*)    All other components within normal limits  I-STAT CHEM 8, ED - Abnormal; Notable for the following components:   Potassium 3.4 (*)    Hemoglobin 15.6 (*)    All other components within normal limits  PROTIME-INR  APTT  DIFFERENTIAL  I-STAT TROPONIN, ED  CBG MONITORING, ED  I-STAT BETA HCG BLOOD, ED ([facility], [facility], [facility] ONLY)    EKG  EKG Interpretation  Date/Time:  Saturday March 18 2017 12:43:25 EST Ventricular Rate:  91 PR Interval:    QRS Duration: 79 QT Interval:  357 QTC Calculation: 440 R Axis:   79 Text Interpretation:  Sinus rhythm Baseline wander in lead(s) II III aVF V3 V5 Otherwise within normal limits Confirmed by Gerhard Munch [phone]) on 03/18/2017 1:09:36 PM       Radiology Ct Head Code Stroke Wo Contrast  Result Date: 03/18/2017 CLINICAL DATA:  Code stroke.  Partial left eye  visual loss. EXAM: CT HEAD WITHOUT CONTRAST TECHNIQUE: Contiguous axial images were obtained from the base of the skull through the vertex without intravenous contrast. COMPARISON:  None. FINDINGS: Brain: There is no evidence of acute infarct, intracranial hemorrhage, mass, midline shift, or extra-axial fluid collection. The ventricles and sulci are normal. Vascular: No hyperdense vessel. Skull: No fracture or focal osseous lesion. Sinuses/Orbits: Visualized paranasal sinuses and mastoid air cells are clear. Visualized orbits are unremarkable. Other: None. ASPECTS [facility] Stroke Program Early CT Score) - Ganglionic level infarction (caudate, lentiform nuclei, internal  capsule, insula, M1-M3 cortex): 7 - Supraganglionic infarction (M4-M6 cortex): 3 Total score (0-10 with 10 being normal): 10 IMPRESSION: 1. Negative head CT. 2. ASPECTS is 10. These results were called by telephone at the time of interpretation on 03/18/2017 at 12:51 pm to Dr. Clarene Duke, who verbally acknowledged these results. Electronically Signed   By: Sebastian Ache M.D.   On: 03/18/2017 12:52    Procedures Procedures (including critical care time)  Medications Ordered in ED Medications  LORazepam (ATIVAN) injection 0.5 mg (0.5 mg Intravenous Given 03/18/17 1323)     Initial Impression / Assessment and Plan / ED Course  I have reviewed the triage vital signs and the nursing notes.  Pertinent labs & imaging results that were available during my care of the patient were reviewed by me and considered in my medical decision making (see chart for details).   EMERGENCY DEPARTMENT [place] OCULAR EXAM "Study: Limited Ultrasound of Orbit "  INDICATIONS: Vision loss Linear probe utilized to obtain images in both long and short axis of the orbit having the patient look left and right if possible.  PERFORMED BY: Myself IMAGES ARCHIVED?: Yes LIMITATIONS: none VIEWS USED: Left orbit INTERPRETATION: No retinal detachment, Lens in proper position,  No obvious retinal detachment, there is a brief period of visible echogenic material in the orbit, otherwise unremarkable      Update:, Patient tolerated bedside ultrasound well. Subsequently, however, the patient described complete vision loss in the left eye. And on exam, has no visual capacity.  I discussed patient's case with our telemetry neurologist, and the patient is not a candidate for TPA. Of also discussed patient's case with our local neurologist, and the patient will be  transfer to our affiliated facility for MRI, further evaluation and management.   2:14 PM Patient had a transient episode of complete left eye vision loss, but is now had return to blurry vision throughout both superior and inferior visual fields. Patient is preparing for transfer to our affiliated Center. I spoken with our ophthalmologist on-call, neurologist on call. Patient will have MRI, MRA, head and neck on arrival, with further ophthalmology involvement as needed.  This previously well female presents with new left-sided hemianopsia, which during her ED stay has waxing, waning severity, with transient period of complete left eye vision loss. Patient's neurologic exam is otherwise unremarkable. Given this new acute neurologic phenomena, I discussed her case with both ophthalmology and neurology, the patient will require transfer to our affiliated Center for further evaluation and management. On transfer, the patient is awake and alert, hemodynamically unremarkable.  Final Clinical Impressions(s) / ED Diagnoses  Vision loss, left, acute   Gerhard Munch, MD 03/18/17 1417

## 2017-03-18 NOTE — ED Triage Notes (Signed)
Pt reports dizziness x3 days and loss of vision approximately 5 minutes PTA. Pt denies pain. Pt alert and oriented.

## 2017-03-18 NOTE — ED Notes (Addendum)
Pt back in room from CT at 1238. TeleNeuro RN on camera at [employer]1236.

## 2017-03-18 NOTE — Discharge Instructions (Signed)
MRI/MRA scan today shows that you have a goiter which is not the cause of your symptoms.  You should be followed by a primary care physician for your goiter and all other health problems.  [facility] on Monday, 03/20/2017 to get a primary care physician.  You can also call Dr. Wynelle LinkSun next week to get your eyes checked.  You should also be followed by a neurologist.  We feel that this was a migraine that you suffered today.  Call [place] [facility] or [facility] arrange for follow-up. Return if concern for any reason

## 2017-03-18 NOTE — ED Notes (Signed)
CBG: 80 

## 2017-03-18 NOTE — ED Notes (Signed)
CODE STROKE paged @1225 

## 2017-03-18 NOTE — Progress Notes (Signed)
IMAGING FOLLOW UP NOTE  MRI brain and MRA head and neck reviewed. No abnormalities. No change in plan as documented by Dr. Amada JupiterKirkpatrick. Plan relayed to Dr. Ethelda ChickJacubowitz, EDP over phone. Follow up with [facility] and Ophtho as outpatient.  -- Milon DikesAshish Chesni Vos, MD [facility] Pager: [phone] If 7pm to 7am, please call on call as listed on AMION.

## 2017-03-18 NOTE — Consult Note (Signed)
Neurology Consultation Reason for Consult: Intermittent vision change Referring Physician: Ethelda Chick, S  CC: Abnormal vision.  History is obtained from: Patient  HPI: Jean Oneill is a 42 y.o. female who presents with intermittent scintillations and vision loss of the left eye.  She states that the scintillations have been a long-standing problem, intermittently happening over the past 5 years.  Today, however, she has had 3 episodes where following the scintillations it is if there is a downward "curtain" that obscures the vision.  When she presented to [facility], it was noted that she had a left upper  Visual defect in the left eye only.  She was not felt to be a TPA candidate.  Since that time, she has had 2 other episodes the most recent of which was associated with paresthesia involving the face and upper neck.  She states that she felt like she had a "creepy crawlies" underneath her skin.  These episodes last for a few minutes at a time and then resolved.  She states that these might be associated with a mild head pressure, predominantly in the back of her head, but not really a headache.  She denies any history of staring spells.  The only episode of loss of consciousness she describes as being immediately following argument with her boyfriend "many" years ago  LKW: 12:30 PM tpa given?: no, resolution of symptoms   ROS: A 14 point ROS was performed and is negative except as noted in the HPI.   History reviewed. No pertinent past medical history.   Family History  Problem Relation Age of Onset  . Cancer Father   . Cancer Mother   . Hypertension Mother   . Diabetes Mother      Social History:  reports that she has been smoking cigars.  She has been smoking about 0.50 packs per day. she has never used smokeless tobacco. She reports that she drinks alcohol. She reports that she uses drugs. Drug: Marijuana.   Exam: Current vital signs: BP 107/73   Pulse 82   Temp 98.7  F (37.1 C) (Oral)   Resp (!) 24   Ht 5' (1.524 m)   Wt 81.6 kg (180 lb)   LMP 03/16/2017   SpO2 100%   BMI 35.15 kg/m  Vital signs in last 24 hours: Temp:  [98.7 F (37.1 C)] 98.7 F (37.1 C) (03/09 1230) Pulse Rate:  [75-94] 82 (03/09 1500) Resp:  [11-24] 24 (03/09 1500) BP: (103-142)/(69-89) 107/73 (03/09 1500) SpO2:  [97 %-100 %] 100 % (03/09 1500) Weight:  [81.6 kg (180 lb)] 81.6 kg (180 lb) (03/09 1236)   Physical Exam  Constitutional: Appears well-developed and well-nourished.  Psych: Affect appropriate to situation Eyes: No scleral injection HENT: No OP obstrucion Head: Normocephalic.  Cardiovascular: Normal rate and regular rhythm.  Respiratory: Effort normal, non-labored breathing GI: Soft.  No distension. There is no tenderness.  Skin: WDI  Neuro: Mental Status: Patient is awake, alert, oriented to person, place, month, year, and situation. Patient is able to give a clear and coherent history. No signs of aphasia or neglect Cranial Nerves: II: Visual Fields are full. Pupils are equal, round, and reactive to light.   III,IV, VI: EOMI without ptosis or diploplia.  V: Facial sensation is symmetric to temperature VII: Facial movement is symmetric.  VIII: hearing is intact to voice X: Uvula elevates symmetrically XI: Shoulder shrug is symmetric. XII: tongue is midline without atrophy or fasciculations.  Motor: Tone is normal. Bulk is  normal. 5/5 strength was present in all four extremities.  Sensory: Sensation is symmetric to light touch and temperature in the arms and legs. Deep Tendon Reflexes: 2+ and symmetric in the biceps and patellae.  Plantars: Toes are downgoing bilaterally.  Cerebellar: FNF and HKS are intact bilaterally   I have reviewed labs in epic and the results pertinent to this consult CMP-ation are: Mild hypokalemia at 3.4  I have reviewed the images obtained: CT head is negative  Impression: 42 year old female with recurrent  scintillations, descending vision loss.  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  I feel that amaurosis fugax is much less likely, however I do think it would be reasonable to assess for carotid stenosis that could predispose to this.  The monnocular nature would argue strongly against seizure as an etiology.  Recommendations: 1) MRI brain, MRA head and neck 2) would consider discussion with ophthalmology 3) Compazine/Toradol for presumed migraine aura 4) if her MRI is negative, I do not think that I would pursue further testing.   Ritta SlotMcNeill Brenyn Petrey, MD [facility] [phone]  If 7pm- 7am, please page neurology on call as listed in AMION.

## 2017-03-18 NOTE — ED Notes (Signed)
Pt states she felt like there was something in her eye and her eye had been watering for several days. Today she was calling her son when it felt like a "curtain came over my left eye", states she looked in the mirror to see if there was anything covering her eye but there was not. Pt states she can see out of the lower L eye, but not the upper section of the L eye.

## 2017-03-18 NOTE — ED Provider Notes (Addendum)
Patient excepted from [facility] transfer.  She complains of abnormal vision in her left eye which started approximately 12:15 PM today.  She describes seeing "sparkles" in the left eye, followed by complete loss of vision in the left eye.  Vision in left eye eventually comes back to normal after a few minutes.  She has had several similar episodes today.  She denies any difficulty in speaking or swallowing no difficulty with gait.  No other associated symptoms.  I was questioning patient she stated that over 1 or 2 minutes she gradually lost vision completely in her left eye for period of approximately 5 minutes and vision gradually returned to normal.     7:45 PM patient  has returned to normal.  I discussed case with Dr. Wynelle Link, ophthalmologist who feels that symptoms consistent with migraine.  She can see patient in office as outpatient.  Dr. Amada Jupiter has evaluate patient in ED and also feels symptoms consistent with migraine, pending results of MRI/MRA.  Dr.Arora from neurology service call me with results of MRI MRA scans.  She will need referral to primary care for goiter.  She also be referred to [facility] and [facility].  She will receive potassium oral supplementation prior to discharge Results for orders placed or performed during the hospital encounter of 03/18/17  Protime-INR  Result Value Ref Range   Prothrombin Time 12.7 11.4 - 15.2 seconds   INR 0.96   APTT  Result Value Ref Range   aPTT 31 24 - 36 seconds  CBC  Result Value Ref Range   WBC 12.3 (H) 4.0 - 10.5 K/uL   RBC 4.55 3.87 - 5.11 MIL/uL   Hemoglobin 14.6 12.0 - 15.0 g/dL   HCT 16.1 09.6 - 04.5 %   MCV 96.0 78.0 - 100.0 fL   MCH 32.1 26.0 - 34.0 pg   MCHC 33.4 30.0 - 36.0 g/dL   RDW 40.9 81.1 - 91.4 %   Platelets 230 150 - 400 K/uL  Differential  Result Value Ref Range   Neutrophils Relative % 61 %   Neutro Abs 7.4 1.7 - 7.7 K/uL   Lymphocytes Relative 32 %   Lymphs Abs 4.0 0.7 -  4.0 K/uL   Monocytes Relative 6 %   Monocytes Absolute 0.8 0.1 - 1.0 K/uL   Eosinophils Relative 1 %   Eosinophils Absolute 0.2 0.0 - 0.7 K/uL   Basophils Relative 0 %   Basophils Absolute 0.0 0.0 - 0.1 K/uL  Comprehensive metabolic panel  Result Value Ref Range   Sodium 138 135 - 145 mmol/L   Potassium 3.4 (L) 3.5 - 5.1 mmol/L   Chloride 104 101 - 111 mmol/L   CO2 24 22 - 32 mmol/L   Glucose, Bld 99 65 - 99 mg/dL   BUN 11 6 - 20 mg/dL   Creatinine, Ser 7.82 0.44 - 1.00 mg/dL   Calcium 9.7 8.9 - 95.6 mg/dL   Total Protein 8.2 (H) 6.5 - 8.1 g/dL   Albumin 4.2 3.5 - 5.0 g/dL   AST 16 15 - 41 U/L   ALT 12 (L) 14 - 54 U/L   Alkaline Phosphatase 77 38 - 126 U/L   Total Bilirubin 0.4 0.3 - 1.2 mg/dL   GFR calc non Af Amer >60 >60 mL/min   GFR calc Af Amer >60 >60 mL/min   Anion gap 10 5 - 15  I-stat troponin, ED  Result Value Ref Range   Troponin i, poc 0.00 0.00 -  0.08 ng/mL   Comment 3          I-Stat Chem 8, ED  Result Value Ref Range   Sodium 141 135 - 145 mmol/L   Potassium 3.4 (L) 3.5 - 5.1 mmol/L   Chloride 102 101 - 111 mmol/L   BUN 9 6 - 20 mg/dL   Creatinine, Ser 1.610.80 0.44 - 1.00 mg/dL   Glucose, Bld 91 65 - 99 mg/dL   Calcium, Ion 0.961.22 0.451.15 - 1.40 mmol/L   TCO2 25 22 - 32 mmol/L   Hemoglobin 15.6 (H) 12.0 - 15.0 g/dL   HCT 40.946.0 81.136.0 - 91.446.0 %  I-Stat beta hCG blood, ED  Result Value Ref Range   I-stat hCG, quantitative <5.0 <5 mIU/mL   Comment 3           Mr Angiogram Neck W Or Wo Contrast  Result Date: 03/18/2017 CLINICAL DATA:  30 minutes prior to evaluation sudden loss of vision in the upper portion of left eye. EXAM: MR HEAD WITHOUT CONTRAST MR CIRCLE OF WILLIS WITHOUT CONTRAST MRA OF THE NECK WITHOUT AND WITH CONTRAST TECHNIQUE: Multiplanar, multiecho pulse sequences of the brain, circle of willis and surrounding structures were obtained without intravenous contrast. Angiographic images of the neck were obtained using MRA technique without and with intravenous  contrast. CONTRAST:  16mL MULTIHANCE GADOBENATE DIMEGLUMINE 529 MG/ML IV SOLN COMPARISON:  None. FINDINGS: MR HEAD FINDINGS Brain: No acute infarction, hemorrhage, hydrocephalus, extra-axial collection or mass lesion. No demyelinating disease. Clear suprasellar cistern. Normal appearance of the chiasm. Vascular: Arterial findings below. Normal dural venous sinus flow voids. Skull and upper cervical spine: Negative for marrow lesion. Sinuses/Orbits: Negative. The globes and other orbital contents have a symmetric normal appearance. MR CIRCLE OF WILLIS FINDINGS Motion artifact at the level of the M1, P2, and left A1 segments which causes complete loss of signal in the left M1 segment and proximal left A1 segment. The P2 segments appear discontinuous at the level of artifact. When accounting for levels of artifact there is no suspected occlusion or stenosis. No noted aneurysm. There is symmetric flow signal within vessels downstream of the noted artifacts. MRA NECK FINDINGS Antegrade flow in both carotid and vertebral arteries on time-of-flight imaging. No stenosis or beading is seen. The great vessels are smooth and diffusely patent. Negative visualized arch. Incidental goiter. IMPRESSION: Brain MRI: Unremarkable exam.  No explanation for symptoms. Intracranial MRA: Motion artifact which distorts the M1 and P2 segments. No evidence of disease. Neck MRA: 1. Normal appearance of the vessels. 2. Goiter. Electronically Signed   By: Marnee SpringJonathon  Watts M.D.   On: 03/18/2017 19:07   Mr Brain Wo Contrast (neuro Protocol)  Result Date: 03/18/2017 CLINICAL DATA:  30 minutes prior to evaluation sudden loss of vision in the upper portion of left eye. EXAM: MR HEAD WITHOUT CONTRAST MR CIRCLE OF WILLIS WITHOUT CONTRAST MRA OF THE NECK WITHOUT AND WITH CONTRAST TECHNIQUE: Multiplanar, multiecho pulse sequences of the brain, circle of willis and surrounding structures were obtained without intravenous contrast. Angiographic images of  the neck were obtained using MRA technique without and with intravenous contrast. CONTRAST:  16mL MULTIHANCE GADOBENATE DIMEGLUMINE 529 MG/ML IV SOLN COMPARISON:  None. FINDINGS: MR HEAD FINDINGS Brain: No acute infarction, hemorrhage, hydrocephalus, extra-axial collection or mass lesion. No demyelinating disease. Clear suprasellar cistern. Normal appearance of the chiasm. Vascular: Arterial findings below. Normal dural venous sinus flow voids. Skull and upper cervical spine: Negative for marrow lesion. Sinuses/Orbits: Negative. The globes and other  orbital contents have a symmetric normal appearance. MR CIRCLE OF WILLIS FINDINGS Motion artifact at the level of the M1, P2, and left A1 segments which causes complete loss of signal in the left M1 segment and proximal left A1 segment. The P2 segments appear discontinuous at the level of artifact. When accounting for levels of artifact there is no suspected occlusion or stenosis. No noted aneurysm. There is symmetric flow signal within vessels downstream of the noted artifacts. MRA NECK FINDINGS Antegrade flow in both carotid and vertebral arteries on time-of-flight imaging. No stenosis or beading is seen. The great vessels are smooth and diffusely patent. Negative visualized arch. Incidental goiter. IMPRESSION: Brain MRI: Unremarkable exam.  No explanation for symptoms. Intracranial MRA: Motion artifact which distorts the M1 and P2 segments. No evidence of disease. Neck MRA: 1. Normal appearance of the vessels. 2. Goiter. Electronically Signed   By: Marnee Spring M.D.   On: 03/18/2017 19:07   Mr Maxine Glenn Head (cerebral Arteries)  Result Date: 03/18/2017 CLINICAL DATA:  30 minutes prior to evaluation sudden loss of vision in the upper portion of left eye. EXAM: MR HEAD WITHOUT CONTRAST MR CIRCLE OF WILLIS WITHOUT CONTRAST MRA OF THE NECK WITHOUT AND WITH CONTRAST TECHNIQUE: Multiplanar, multiecho pulse sequences of the brain, circle of willis and surrounding structures  were obtained without intravenous contrast. Angiographic images of the neck were obtained using MRA technique without and with intravenous contrast. CONTRAST:  16mL MULTIHANCE GADOBENATE DIMEGLUMINE 529 MG/ML IV SOLN COMPARISON:  None. FINDINGS: MR HEAD FINDINGS Brain: No acute infarction, hemorrhage, hydrocephalus, extra-axial collection or mass lesion. No demyelinating disease. Clear suprasellar cistern. Normal appearance of the chiasm. Vascular: Arterial findings below. Normal dural venous sinus flow voids. Skull and upper cervical spine: Negative for marrow lesion. Sinuses/Orbits: Negative. The globes and other orbital contents have a symmetric normal appearance. MR CIRCLE OF WILLIS FINDINGS Motion artifact at the level of the M1, P2, and left A1 segments which causes complete loss of signal in the left M1 segment and proximal left A1 segment. The P2 segments appear discontinuous at the level of artifact. When accounting for levels of artifact there is no suspected occlusion or stenosis. No noted aneurysm. There is symmetric flow signal within vessels downstream of the noted artifacts. MRA NECK FINDINGS Antegrade flow in both carotid and vertebral arteries on time-of-flight imaging. No stenosis or beading is seen. The great vessels are smooth and diffusely patent. Negative visualized arch. Incidental goiter. IMPRESSION: Brain MRI: Unremarkable exam.  No explanation for symptoms. Intracranial MRA: Motion artifact which distorts the M1 and P2 segments. No evidence of disease. Neck MRA: 1. Normal appearance of the vessels. 2. Goiter. Electronically Signed   By: Marnee Spring M.D.   On: 03/18/2017 19:07   Ct Head Code Stroke Wo Contrast  Result Date: 03/18/2017 CLINICAL DATA:  Code stroke.  Partial left eye visual loss. EXAM: CT HEAD WITHOUT CONTRAST TECHNIQUE: Contiguous axial images were obtained from the base of the skull through the vertex without intravenous contrast. COMPARISON:  None. FINDINGS: Brain:  There is no evidence of acute infarct, intracranial hemorrhage, mass, midline shift, or extra-axial fluid collection. The ventricles and sulci are normal. Vascular: No hyperdense vessel. Skull: No fracture or focal osseous lesion. Sinuses/Orbits: Visualized paranasal sinuses and mastoid air cells are clear. Visualized orbits are unremarkable. Other: None. ASPECTS [facility] Stroke Program Early CT Score) - Ganglionic level infarction (caudate, lentiform nuclei, internal capsule, insula, M1-M3 cortex): 7 - Supraganglionic infarction (M4-M6 cortex): 3 Total score (0-10  with 10 being normal): 10 IMPRESSION: 1. Negative head CT. 2. ASPECTS is 10. These results were called by telephone at the time of interpretation on 03/18/2017 at 12:51 pm to Dr. Clarene Duke, who verbally acknowledged these results. Electronically Signed   By: Sebastian Ache M.D.   On: 03/18/2017 12:52  Dx #1 optimal plegic migraine #2 goiter #3 hypokalemia   Doug Sou, MD 03/18/17 1957 8:10 PM alert ambulatory not lightheaded on standing   Doug Sou, MD 03/18/17 2013

## 2017-03-18 NOTE — ED Notes (Signed)
[facility] at bedside 

## 2017-03-20 LAB — CBG MONITORING, ED: Glucose-Capillary: 80 mg/dL (ref 65–99)

## 2017-10-08 NOTE — Congregational Nurse Program (Signed)
Congregational Nurse Program Note  Date of Encounter: 10/08/2017  Past Medical History: No past medical history on file.  Encounter Details: CNP Questionnaire - 10/08/17 2334      Questionnaire   Patient Status  Not Applicable    Race  Black or African American    Location Patient Served At  [employer], [employer]    Uninsured  Not Applicable    Food  No food insecurities    Housing/Utilities  Yes, have permanent housing    Transportation  No transportation needs    Interpersonal Safety  Yes, feel physically and emotionally safe where you currently live    Medication  No medication insecurities    Medical Provider  Yes    Referrals  Not Applicable    ED Visit Averted  Not Applicable    Life-Saving Intervention Made  Not Applicable      Seen  at the [employer].No complaints [address] [place], [place] [facility] [phone]

## 2018-02-10 ENCOUNTER — Emergency Department (HOSPITAL_COMMUNITY)
Admission: EM | Admit: 2018-02-10 | Discharge: 2018-02-10 | Disposition: A | Discharge status: Home / Self Care | Payer: Medicaid Other | Attending: Emergency Medicine | Admitting: Emergency Medicine

## 2018-02-10 DIAGNOSIS — K047 Periapical abscess without sinus: Secondary | ICD-10-CM | POA: Diagnosis not present

## 2018-02-10 DIAGNOSIS — R220 Localized swelling, mass and lump, head: Secondary | ICD-10-CM | POA: Diagnosis present

## 2018-02-10 DIAGNOSIS — F17210 Nicotine dependence, cigarettes, uncomplicated: Secondary | ICD-10-CM | POA: Insufficient documentation

## 2018-02-10 DIAGNOSIS — K029 Dental caries, unspecified: Secondary | ICD-10-CM | POA: Diagnosis not present

## 2018-02-10 MED ORDER — ACETAMINOPHEN 500 MG PO TABS
1000.0000 mg | ORAL_TABLET | Freq: Once | ORAL | Status: AC
Start: 2018-02-10 — End: 2018-02-10
  Administered 2018-02-10: 1000 mg via ORAL
  Filled 2018-02-10: qty 2

## 2018-02-10 MED ORDER — IBUPROFEN 400 MG PO TABS
600.0000 mg | ORAL_TABLET | Freq: Once | ORAL | Status: AC
  Administered 2018-02-10: 600 mg via ORAL
  Filled 2018-02-10: qty 2

## 2018-02-10 MED ORDER — AMOXICILLIN 500 MG PO CAPS: 500.0000 mg | ORAL_CAPSULE | Freq: Three times a day (TID) | ORAL | 0 refills | Status: DC

## 2018-02-10 MED ORDER — AMOXICILLIN 250 MG PO CAPS
500.0000 mg | ORAL_CAPSULE | Freq: Once | ORAL | Status: AC
  Administered 2018-02-10: 500 mg via ORAL
  Filled 2018-02-10: qty 2

## 2018-02-10 NOTE — ED Triage Notes (Signed)
Pt states she awoke with pain and swelling to her right face and jaw, states she thinks is a tooth

## 2018-02-10 NOTE — Discharge Instructions (Addendum)
Use ice packs on your face for comfort. Take ibuprofen 600 mg + acetaminophen 1000 mg every 6 hrs for pain. Take the antibiotic until gone. Please follow up with a dentist this week. Return to the ED if you have difficulty breathing, swallowing, you get a high fever or the swelling of your face gets worse.

## 2018-02-10 NOTE — ED Provider Notes (Signed)
[facility] EMERGENCY DEPARTMENT Provider Note   CSN: [id number] Arrival date & time: 02/10/18  0021  Time seen 03:20 AM   History   Chief Complaint Chief Complaint  Patient presents with  . Facial Swelling    HPI Jean Oneill is a 42 y.o. female.  HPI patient states 2 days ago she started having pain and swelling on the right side of her mouth and states her gums are sore.  She states she feels like she has pain in her upper tooth.  She denies any fever, difficulty swallowing or breathing.  She states hot things cold things in the air hitting her tooth makes it hurt more, nothing makes it feel better.  She took a BC powder about 9:30 PM.  She states she saw a dentist last year and they replaced some of her old fillings and she feels like she has been having problems ever since.  PCP Patient, No Pcp Per   No past medical history on file.  There are no active problems to display for this patient.   Past Surgical History:  Procedure Laterality Date  . lipoma removal       OB History    Gravida  6   Para  4   Term  4   Preterm      AB  1   Living  4     SAB  1   TAB      Ectopic      Multiple      Live Births               Home Medications    No meds  Prior to Admission medications   Medication Sig Start Date End Date Taking? Authorizing Provider  albuterol (PROVENTIL HFA;VENTOLIN HFA) 108 (90 BASE) MCG/ACT inhaler Inhale 1-2 puffs into the lungs every 6 (six) hours as needed for wheezing or shortness of breath. 05/26/14   Burgess Amor, PA-C  amoxicillin (AMOXIL) 500 MG capsule Take 1 capsule (500 mg total) by mouth 3 (three) times daily. 02/10/18   Devoria Albe, MD  ibuprofen (ADVIL,MOTRIN) 800 MG tablet Take 800 mg by mouth every 8 (eight) hours as needed for mild pain or moderate pain.    [provider]  metroNIDAZOLE (FLAGYL) 500 MG tablet Take 1 tablet (500 mg total) by mouth 2 (two) times daily. One po bid x 7 days 10/13/16   Geoffery Lyons, MD  nitrofurantoin, macrocrystal-monohydrate, (MACROBID) 100 MG capsule Take 1 capsule (100 mg total) by mouth 2 (two) times daily. 12/26/15   Samuel Jester, DO  ondansetron (ZOFRAN) 4 MG tablet Take 1 tablet (4 mg total) by mouth every 8 (eight) hours as needed for nausea or vomiting. 12/26/15   Samuel Jester, DO    Family History Family History  Problem Relation Age of Onset  . Cancer Father   . Cancer Mother   . Hypertension Mother   . Diabetes Mother     Social History Social History   Tobacco Use  . Smoking status: Current Every Day Smoker    Packs/day: 0.50    Types: Cigars  . Smokeless tobacco: Never Used  Substance Use Topics  . Alcohol use: Yes    Comment: ocassionally  . Drug use: Yes    Types: Marijuana    Comment: last weekend     Allergies   Patient has no known allergies.   Review of Systems Review of Systems  All other systems reviewed and are  negative.    Physical Exam Updated Vital Signs BP 118/77 (BP Location: Right Arm)   Pulse 62   Temp 98.1 F (36.7 C) (Oral)   Resp 15   Ht 5' (1.524 m)   Wt 79.4 kg   SpO2 98%   BMI 34.18 kg/m   Physical Exam Vitals signs and nursing note reviewed.  Constitutional:      Appearance: Normal appearance.  HENT:     Head: Normocephalic and atraumatic.     Right Ear: External ear normal.     Left Ear: External ear normal.     Nose: Nose normal.     Mouth/Throat:     Mouth: Mucous membranes are moist.   Eyes:     Extraocular Movements: Extraocular movements intact.     Conjunctiva/sclera: Conjunctivae normal.     Pupils: Pupils are equal, round, and reactive to light.  Neck:     Musculoskeletal: Normal range of motion.  Cardiovascular:     Rate and Rhythm: Normal rate.  Pulmonary:     Effort: Pulmonary effort is normal. No respiratory distress.  Musculoskeletal: Normal range of motion.  Skin:    General: Skin is warm and dry.     Findings: No erythema.  Neurological:      General: No focal deficit present.     Mental Status: She is alert and oriented to person, place, and time.     Cranial Nerves: Cranial nerve deficit present.  Psychiatric:        Mood and Affect: Mood normal.        Behavior: Behavior normal.        Thought Content: Thought content normal.      ED Treatments / Results  Labs (all labs ordered are listed, but only abnormal results are displayed) Labs Reviewed - No data to display  EKG None  Radiology No results found.  Procedures Procedures (including critical care time)  Medications Ordered in ED Medications  amoxicillin (AMOXIL) capsule 500 mg (500 mg Oral Given 02/10/18 0334)  acetaminophen (TYLENOL) tablet 1,000 mg (1,000 mg Oral Given 02/10/18 0334)  ibuprofen (ADVIL,MOTRIN) tablet 600 mg (600 mg Oral Given 02/10/18 0334)     Initial Impression / Assessment and Plan / ED Course  I have reviewed the triage vital signs and the nursing notes.  Pertinent labs & imaging results that were available during my care of the patient were reviewed by me and considered in my medical decision making (see chart for details).     Patient was started on amoxicillin, she was advised to use ice packs over the swollen part of her face.  She can take Tylenol Motrin for pain.  She states she is going to follow-up with the dental [facility]. Final Clinical Impressions(s) / ED Diagnoses   Final diagnoses:  Dental infection  Dental caries    ED Discharge Orders         Ordered    amoxicillin (AMOXIL) 500 MG capsule  3 times daily     02/10/18 0356        OTC ibuprofen and acetaminophen  Plan discharge  Devoria Albe, MD, Concha Pyo, MD 02/10/18 [phone]

## 2018-03-04 ENCOUNTER — Emergency Department (HOSPITAL_COMMUNITY)
Admission: EM | Admit: 2018-03-04 | Discharge: 2018-03-04 | Disposition: A | Discharge status: Home / Self Care | Payer: Medicaid Other | Attending: Emergency Medicine | Admitting: Emergency Medicine

## 2018-03-04 ENCOUNTER — Other Ambulatory Visit: Payer: Self-pay

## 2018-03-04 ENCOUNTER — Encounter (HOSPITAL_COMMUNITY): Payer: Self-pay

## 2018-03-04 DIAGNOSIS — F17290 Nicotine dependence, other tobacco product, uncomplicated: Secondary | ICD-10-CM | POA: Insufficient documentation

## 2018-03-04 DIAGNOSIS — F1210 Cannabis abuse, uncomplicated: Secondary | ICD-10-CM | POA: Diagnosis not present

## 2018-03-04 DIAGNOSIS — R69 Illness, unspecified: Secondary | ICD-10-CM

## 2018-03-04 DIAGNOSIS — J111 Influenza due to unidentified influenza virus with other respiratory manifestations: Secondary | ICD-10-CM | POA: Insufficient documentation

## 2018-03-04 DIAGNOSIS — R61 Generalized hyperhidrosis: Secondary | ICD-10-CM | POA: Diagnosis not present

## 2018-03-04 DIAGNOSIS — R509 Fever, unspecified: Secondary | ICD-10-CM | POA: Diagnosis present

## 2018-03-04 MED ORDER — OSELTAMIVIR PHOSPHATE 75 MG PO CAPS: 75.0000 mg | ORAL_CAPSULE | Freq: Two times a day (BID) | ORAL | 0 refills | Status: AC

## 2018-03-04 MED ORDER — OSELTAMIVIR PHOSPHATE 75 MG PO CAPS
75.0000 mg | ORAL_CAPSULE | Freq: Once | ORAL | Status: AC
  Administered 2018-03-04: 75 mg via ORAL
  Filled 2018-03-04: qty 1

## 2018-03-04 NOTE — ED Provider Notes (Signed)
[facility] EMERGENCY DEPARTMENT Provider Note   CSN: [id number] Arrival date & time: 03/04/18  2591    History   Chief Complaint Chief Complaint  Patient presents with  . Fever    cough    HPI Jean Oneill is a 43 y.o. female.   The history is provided by the patient.  Fever  She had onset yesterday of subjective fever with associated chills and sweats.  There has been a slight cough which is nonproductive.  She denies rhinorrhea, sore throat, nausea, vomiting, diarrhea.  She denies arthralgias or myalgias.  There have been no sick contacts.  Ibuprofen has given temporary relief of fever.  She denies travel to [place] or contact with people who have traveled to [place].  History reviewed. No pertinent past medical history.  There are no active problems to display for this patient.   Past Surgical History:  Procedure Laterality Date  . lipoma removal       OB History    Gravida  6   Para  4   Term  4   Preterm      AB  1   Living  4     SAB  1   TAB      Ectopic      Multiple      Live Births               Home Medications    Prior to Admission medications   Medication Sig Start Date End Date Taking? Authorizing Provider  albuterol (PROVENTIL HFA;VENTOLIN HFA) 108 (90 BASE) MCG/ACT inhaler Inhale 1-2 puffs into the lungs every 6 (six) hours as needed for wheezing or shortness of breath. 05/26/14   Burgess Amor, PA-C  amoxicillin (AMOXIL) 500 MG capsule Take 1 capsule (500 mg total) by mouth 3 (three) times daily. 02/10/18   Devoria Albe, MD  ibuprofen (ADVIL,MOTRIN) 800 MG tablet Take 800 mg by mouth every 8 (eight) hours as needed for mild pain or moderate pain.    [provider]  metroNIDAZOLE (FLAGYL) 500 MG tablet Take 1 tablet (500 mg total) by mouth 2 (two) times daily. One po bid x 7 days 10/13/16   Geoffery Lyons, MD  nitrofurantoin, macrocrystal-monohydrate, (MACROBID) 100 MG capsule Take 1 capsule (100 mg total) by mouth 2 (two) times  daily. 12/26/15   Samuel Jester, DO  ondansetron (ZOFRAN) 4 MG tablet Take 1 tablet (4 mg total) by mouth every 8 (eight) hours as needed for nausea or vomiting. 12/26/15   Samuel Jester, DO    Family History Family History  Problem Relation Age of Onset  . Cancer Father   . Cancer Mother   . Hypertension Mother   . Diabetes Mother     Social History Social History   Tobacco Use  . Smoking status: Current Every Day Smoker    Packs/day: 0.50    Types: Cigars  . Smokeless tobacco: Never Used  Substance Use Topics  . Alcohol use: Never    Frequency: Never  . Drug use: Yes    Types: Marijuana    Comment: last weekend     Allergies   Patient has no known allergies.   Review of Systems Review of Systems  Constitutional: Positive for fever.  All other systems reviewed and are negative.    Physical Exam Updated Vital Signs BP 102/71 (BP Location: Right Arm)   Pulse 98   Temp 99.3 F (37.4 C) (Oral)   Resp 16  Ht 5' (1.524 m)   Wt 81.2 kg   LMP 02/26/2018 (Approximate)   BMI 34.96 kg/m   Physical Exam Vitals signs and nursing note reviewed.    43 year old female, resting comfortably and in no acute distress. Vital signs are normal. Head is normocephalic and atraumatic. PERRLA, EOMI. Oropharynx is clear. Neck is nontender and supple without adenopathy or JVD. Back is nontender and there is no CVA tenderness. Lungs are clear without rales, wheezes, or rhonchi. Chest is nontender. Heart has regular rate and rhythm without murmur. Abdomen is soft, flat, nontender without masses or hepatosplenomegaly and peristalsis is normoactive. Extremities have no cyanosis or edema, full range of motion is present. Skin is warm and dry without rash. Neurologic: Mental status is normal, cranial nerves are intact, there are no motor or sensory deficits.  ED Treatments / Results   Procedures Procedures   Medications Ordered in ED Medications  oseltamivir  (TAMIFLU) capsule 75 mg (75 mg Oral Given 03/04/18 0338)     Initial Impression / Assessment and Plan / ED Course  I have reviewed the triage vital signs and the nursing notes.  Influenza-like illness.  With prevalence of influenza in the community currently, it is felt to be appropriate to treat her for influenza.  I have discussed with her risks and benefits of oseltamivir and have given her prescription for same.  Advised to continue taking acetaminophen and/or ibuprofen as needed for fever.  Return precautions discussed.  Final Clinical Impressions(s) / ED Diagnoses   Final diagnoses:  Influenza-like illness    ED Discharge Orders         Ordered    oseltamivir (TAMIFLU) 75 MG capsule  Every 12 hours     03/04/18 0333           Dione Booze, MD 03/04/18 [phone]

## 2018-03-04 NOTE — ED Triage Notes (Signed)
Pt reports fever, chills at home and cough x 2 days. Pt reports taking ibuprofen 400mg  at home about one hour ago.

## 2018-08-07 ENCOUNTER — Other Ambulatory Visit: Payer: Medicaid Other

## 2018-08-07 ENCOUNTER — Other Ambulatory Visit: Payer: Self-pay

## 2018-08-07 DIAGNOSIS — Z20822 Contact with and (suspected) exposure to covid-19: Secondary | ICD-10-CM

## 2018-08-09 LAB — NOVEL CORONAVIRUS, NAA: SARS-CoV-2, NAA: NOT DETECTED

## 2018-08-14 ENCOUNTER — Telehealth: Payer: Self-pay | Admitting: General Practice

## 2018-08-14 NOTE — Telephone Encounter (Signed)
Pt called in , gave Neg test results, expressed understanding  °

## 2019-09-12 ENCOUNTER — Other Ambulatory Visit: Payer: Self-pay

## 2019-09-12 DIAGNOSIS — R519 Headache, unspecified: Secondary | ICD-10-CM | POA: Diagnosis not present

## 2019-09-12 DIAGNOSIS — Z5321 Procedure and treatment not carried out due to patient leaving prior to being seen by health care provider: Secondary | ICD-10-CM | POA: Insufficient documentation

## 2019-09-13 ENCOUNTER — Encounter (HOSPITAL_COMMUNITY): Payer: Self-pay

## 2019-09-13 ENCOUNTER — Encounter (HOSPITAL_COMMUNITY): Payer: Self-pay | Admitting: Emergency Medicine

## 2019-09-13 ENCOUNTER — Other Ambulatory Visit: Payer: Self-pay

## 2019-09-13 ENCOUNTER — Emergency Department (HOSPITAL_COMMUNITY)
Admission: EM | Admit: 2019-09-13 | Discharge: 2019-09-13 | Disposition: A | Discharge status: Home / Self Care | Payer: 59 | Source: Home / Self Care | Attending: Emergency Medicine | Admitting: Emergency Medicine

## 2019-09-13 ENCOUNTER — Emergency Department (HOSPITAL_COMMUNITY)
Admission: EM | Admit: 2019-09-13 | Discharge: 2019-09-13 | Disposition: A | Discharge status: Home / Self Care | Payer: 59 | Attending: Emergency Medicine | Admitting: Emergency Medicine

## 2019-09-13 DIAGNOSIS — K0889 Other specified disorders of teeth and supporting structures: Secondary | ICD-10-CM | POA: Insufficient documentation

## 2019-09-13 DIAGNOSIS — L539 Erythematous condition, unspecified: Secondary | ICD-10-CM | POA: Insufficient documentation

## 2019-09-13 DIAGNOSIS — F17290 Nicotine dependence, other tobacco product, uncomplicated: Secondary | ICD-10-CM | POA: Insufficient documentation

## 2019-09-13 DIAGNOSIS — Z79899 Other long term (current) drug therapy: Secondary | ICD-10-CM | POA: Insufficient documentation

## 2019-09-13 DIAGNOSIS — R519 Headache, unspecified: Secondary | ICD-10-CM | POA: Diagnosis not present

## 2019-09-13 MED ORDER — AMOXICILLIN 500 MG PO CAPS: 500.0000 mg | ORAL_CAPSULE | Freq: Three times a day (TID) | ORAL | 0 refills | Status: AC

## 2019-09-13 MED ORDER — OXYCODONE-ACETAMINOPHEN 5-325 MG PO TABS
1.0000 | ORAL_TABLET | Freq: Once | ORAL | Status: AC
  Administered 2019-09-13: 1 via ORAL
  Filled 2019-09-13 (×2): qty 1

## 2019-09-13 NOTE — ED Provider Notes (Signed)
Not in room on attempted evaluation.   Glynn Octave, MD 09/13/19 0330

## 2019-09-13 NOTE — ED Provider Notes (Signed)
[facility] EMERGENCY DEPARTMENT Provider Note   CSN: [id number] Arrival date & time: 09/13/19  1459     History Chief Complaint  Patient presents with  . Jaw Pain    Jean Oneill is a 44 y.o. female who presents to the ED today complaining of gradual onset, constant, achy, R upper dental pain for the past 8 days. Pt reports that 1 year ago she had similar symptoms on both side of her mouth; she saw an oral surgeon and had her wisdom teeth on the left side removed. She states that during that time they chipped her right upper wisdom tooth but did not say anything about it. Pt went to see another dentist and was told her tooth was chipped causing her pain. She has been unable to have her right wisdom teeth removed due to COVID 19. She has not tried to follow back up with them recently however 8 days ago began having worsening pain. Pt states she believes she is grinding her teeth at nighttime as well as she wakes up in the mornings with pain all over her mouth however this subsides throughout the day and it is mostly her right upper wisdom tooth that is bothering her. She states she has felt a bad taste in her mouth today as well. Pt denies fevers or chills at home. She attempted to come to the ED last night however LWBS. She has been taking Tylenol and Goody powder at home without relief.   The history is provided by the patient and medical records.       History reviewed. No pertinent past medical history.  There are no problems to display for this patient.   Past Surgical History:  Procedure Laterality Date  . lipoma removal       OB History    Gravida  6   Para  4   Term  4   Preterm      AB  1   Living  4     SAB  1   TAB      Ectopic      Multiple      Live Births              Family History  Problem Relation Age of Onset  . Cancer Father   . Cancer Mother   . Hypertension Mother   . Diabetes Mother     Social History   Tobacco Use  .  Smoking status: Current Every Day Smoker    Packs/day: 0.50    Types: Cigars  . Smokeless tobacco: Never Used  Vaping Use  . Vaping Use: Never used  Substance Use Topics  . Alcohol use: Never  . Drug use: Yes    Types: Marijuana    Comment: last weekend    Home Medications Prior to Admission medications   Medication Sig Start Date End Date Taking? Authorizing Provider  albuterol (PROVENTIL HFA;VENTOLIN HFA) 108 (90 BASE) MCG/ACT inhaler Inhale 1-2 puffs into the lungs every 6 (six) hours as needed for wheezing or shortness of breath. 05/26/14   Burgess Amor, PA-C  amoxicillin (AMOXIL) 500 MG capsule Take 1 capsule (500 mg total) by mouth 3 (three) times daily. 09/13/19   Hyman Hopes, Jaser Fullen, PA-C  ibuprofen (ADVIL,MOTRIN) 800 MG tablet Take 800 mg by mouth every 8 (eight) hours as needed for mild pain or moderate pain.    [provider]  ondansetron (ZOFRAN) 4 MG tablet Take 1 tablet (4 mg total) by  mouth every 8 (eight) hours as needed for nausea or vomiting. 12/26/15   Samuel Jester, DO  oseltamivir (TAMIFLU) 75 MG capsule Take 1 capsule (75 mg total) by mouth every 12 (twelve) hours. 03/04/18   Dione Booze, MD    Allergies    Patient has no known allergies.  Review of Systems   Review of Systems  Constitutional: Negative for chills and fever.  HENT: Positive for dental problem.     Physical Exam Updated Vital Signs BP (!) 141/85 (BP Location: Right Arm)   Pulse 82   Temp 98 F (36.7 C) (Oral)   Resp 20   Ht 5' (1.524 m)   Wt 68 kg   SpO2 100%   BMI 29.29 kg/m   Physical Exam Vitals and nursing note reviewed.  Constitutional:      Appearance: She is not ill-appearing.  HENT:     Head: Normocephalic and atraumatic.     Mouth/Throat:     Comments: Nose clear.  R upper tooth #1 extruding through gumline, small area is fractured with caries with TTP, with minimal surrounding gingival swelling and erythema, no definite abscess, no evidence of ludwig's.    Oropharynx clear and moist, without uvular swelling or deviation, no trismus or drooling, no tonsillar swelling or erythema, no exudates.   Eyes:     Conjunctiva/sclera: Conjunctivae normal.  Cardiovascular:     Rate and Rhythm: Normal rate and regular rhythm.     Pulses: Normal pulses.  Pulmonary:     Effort: Pulmonary effort is normal.     Breath sounds: Normal breath sounds. No wheezing, rhonchi or rales.  Skin:    General: Skin is warm and dry.     Coloration: Skin is not jaundiced.  Neurological:     Mental Status: She is alert.     ED Results / Procedures / Treatments   Labs (all labs ordered are listed, but only abnormal results are displayed) Labs Reviewed - No data to display  EKG None  Radiology No results found.  Procedures Procedures (including critical care time)  Medications Ordered in ED Medications  oxyCODONE-acetaminophen (PERCOCET/ROXICET) 5-325 MG per tablet 1 tablet (1 tablet Oral Given 09/13/19 1921)    ED Course  I have reviewed the triage vital signs and the nursing notes.  Pertinent labs & imaging results that were available during my care of the patient were reviewed by me and considered in my medical decision making (see chart for details).    MDM Rules/Calculators/A&P                          44 year old female who presents to the ED today complaining of right upper dental pain for the past 8 days.  She reports issues with her wisdom teeth in the past, has not followed up with her oral surgeon/dentist.  Has been taking over-the-counter medications at home including Tylenol, Goody powders without relief.  Has not tried ibuprofen.  States she has a foul taste in her mouth.  No fevers or chills at home.  On arrival to the ED patient is afebrile, nontachycardic nontachypneic and appears to be in no acute distress.  Personally visualized the right upper wisdom tooth extruding through the gumline with dental caries and tenderness palpation.  There is  a small area that is fractured as well.  This is not new.  There is no definable abscess to be drained.  No signs of Ludwig's angina.  Provided 1  dose of narcotic pain medication in the ED however I will not prescribe this home.  Patient is instructed to take ibuprofen as needed.  Will provide antibiotic however patient urged to follow-up with her dentist for further evaluation.  She is in agreement with plan is stable for discharge home.    This note was prepared using Dragon voice recognition software and may include unintentional dictation errors due to the inherent limitations of voice recognition software.  Final Clinical Impression(s) / ED Diagnoses Final diagnoses:  Pain, dental    Rx / DC Orders ED Discharge Orders         Ordered    amoxicillin (AMOXIL) 500 MG capsule  3 times daily        09/13/19 1935           Discharge Instructions     Please follow up with your dentist/oral surgeon regarding your dental pain. I would recommend 800 mg (4 OTC tablets) Ibuprofen every 8 hours as needed for pain. Do not exceed this amount.   I have also prescribed an antibiotic for you to take to help cover for an infection.  Return to the ED for any worsening symptoms       Tanda Rockers, Cordelia Poche 09/13/19 1936    Vanetta Mulders, MD 09/14/19 2053

## 2019-09-13 NOTE — ED Triage Notes (Signed)
Pt to er, pt states that she might be grinding her teeth at night, but for the past 8 days she has been having some jaw and tooth pain.  Pt states that she has a tooth that it broken .

## 2019-09-13 NOTE — ED Triage Notes (Signed)
Pt reports right sided facial pain x 1 week. Pt has taken OTC medication with no relief.

## 2019-09-13 NOTE — Discharge Instructions (Addendum)
Please follow up with your dentist/oral surgeon regarding your dental pain. I would recommend 800 mg (4 OTC tablets) Ibuprofen every 8 hours as needed for pain. Do not exceed this amount.   I have also prescribed an antibiotic for you to take to help cover for an infection.  Return to the ED for any worsening symptoms

## 2019-10-25 IMAGING — CT CT HEAD CODE STROKE
3 series · 15 of 47 positions shown, 18 images · non-contrast
Comparison: None.

CLINICAL DATA: Code stroke.  Partial left eye visual loss.

EXAM:
CT HEAD WITHOUT CONTRAST
TECHNIQUE: Contiguous axial images were obtained from the base of the skull
through the vertex without intravenous contrast.

[Series 2: head code stroke wo · axial · 0.44mm/px · z∈[-7,+118]mm · 9 of 30 slices shown, 12 images]
[im 3/30  brain]
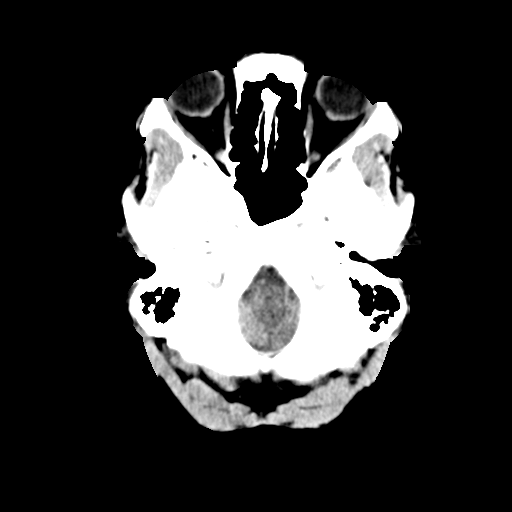
[im 3/30  bone]
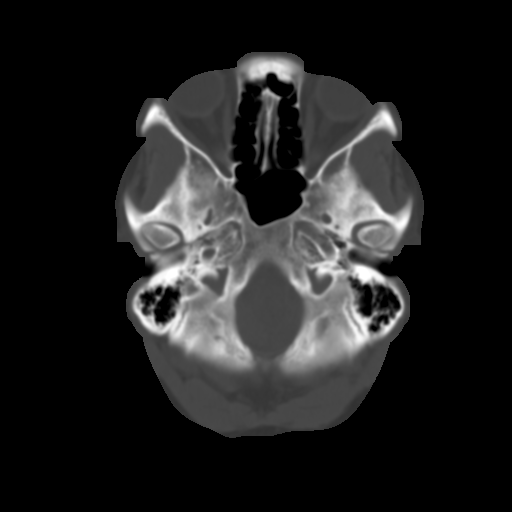
[im 6/30  brain]
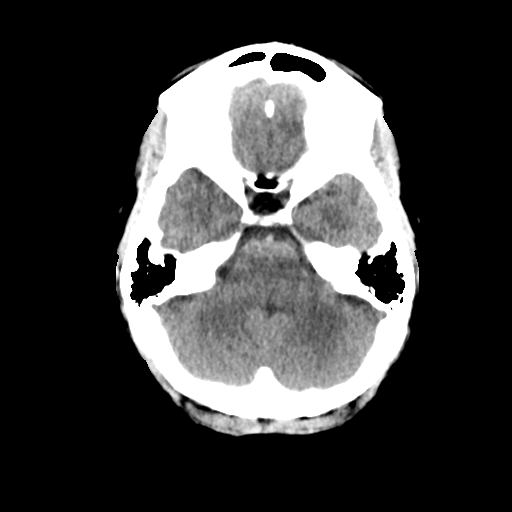
[im 9/30  brain]
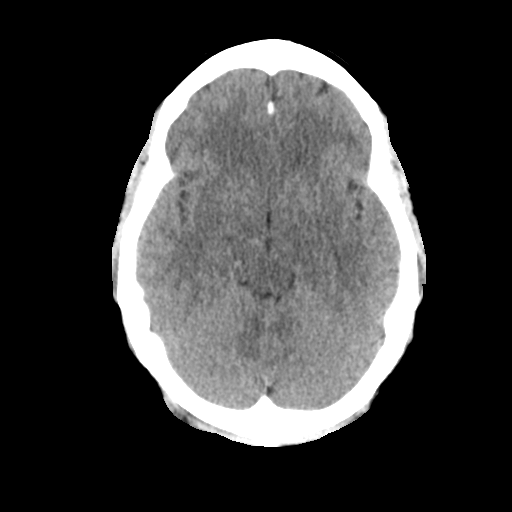
[im 12/30  brain]
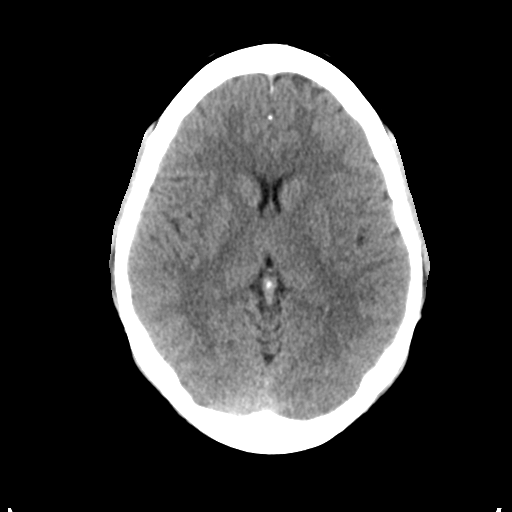
[im 16/30  brain]
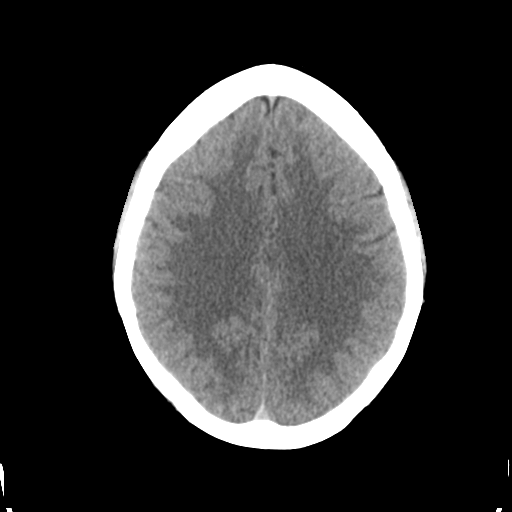
[im 16/30  bone]
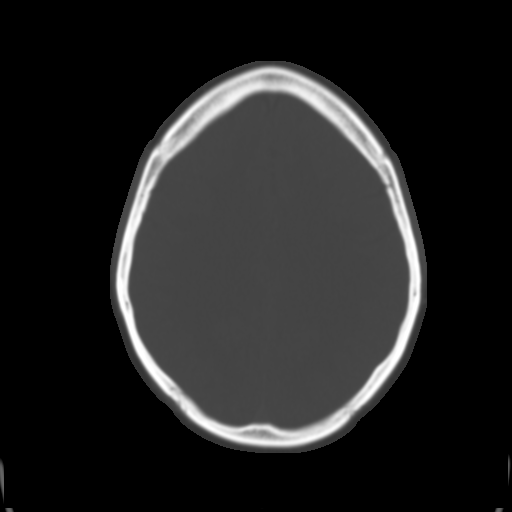
[im 19/30  brain]
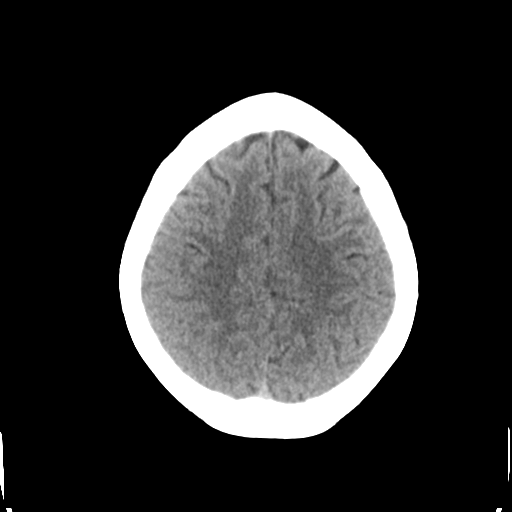
[im 22/30  brain]
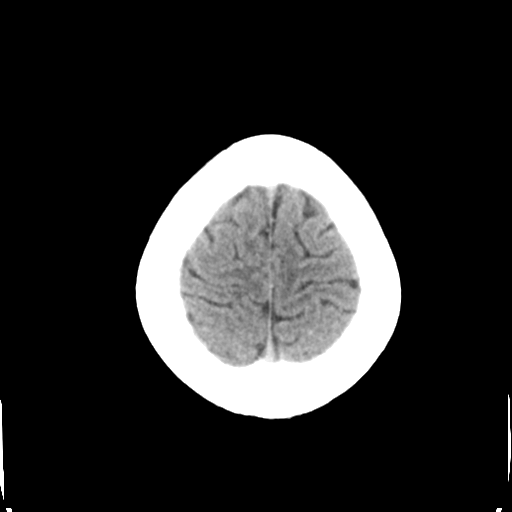
[im 25/30  brain]
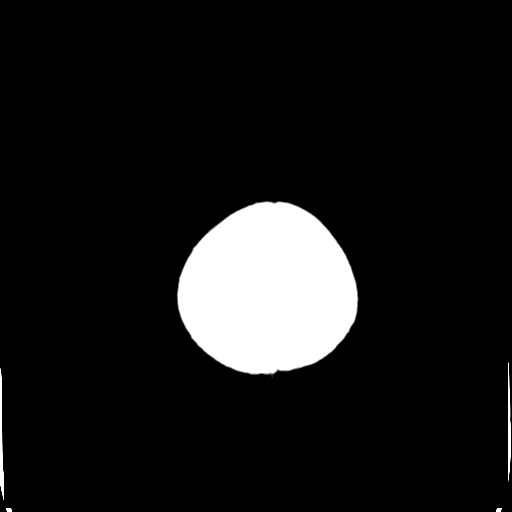
[im 28/30  brain]
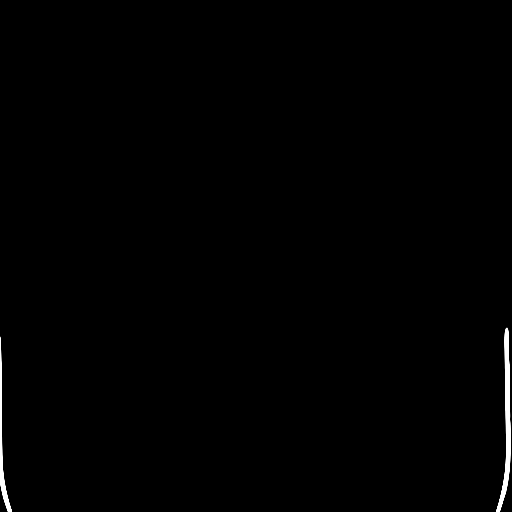
[im 28/30  bone]
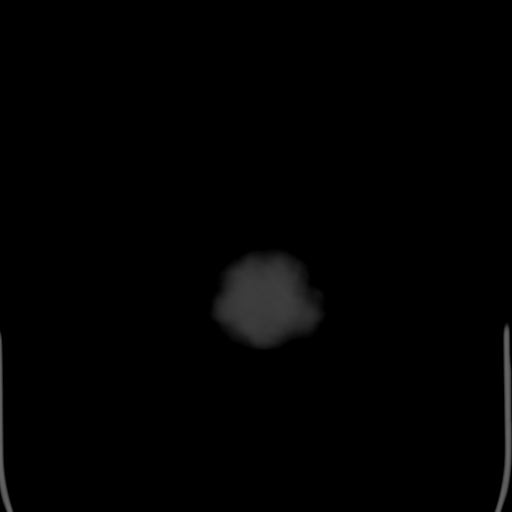

[Series 4: coronal soft tissue · coronal · 0.32mm/px · 3 of 67 slices shown]
[im 23/67  brain]
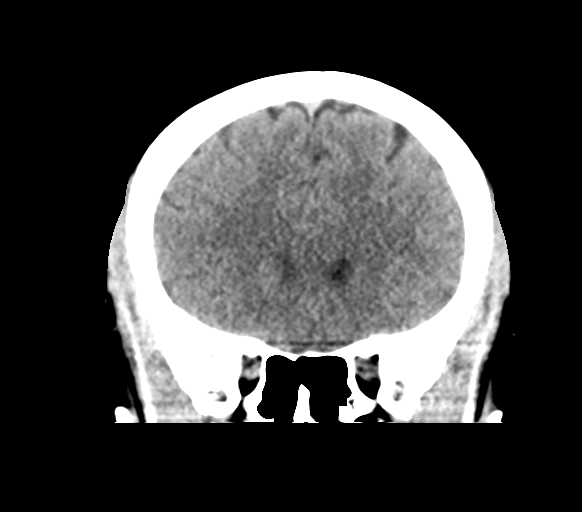
[im 30/67  brain]
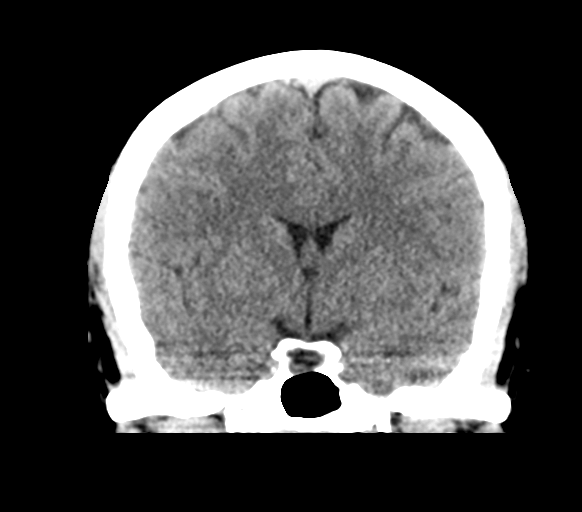
[im 37/67  brain]
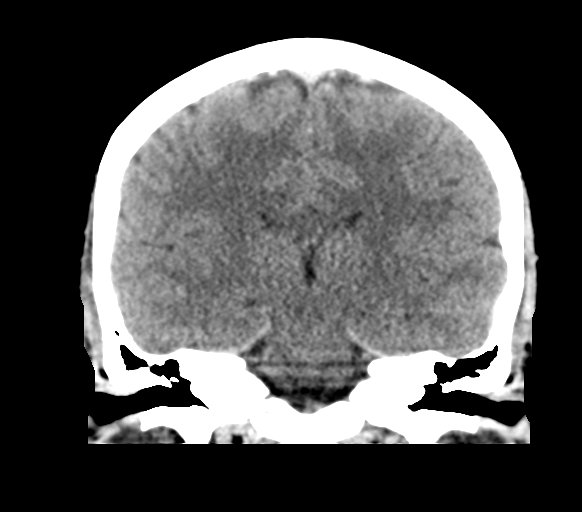

[Series 5: sagittal soft tissue · sagittal · 0.31mm/px · 3 of 67 slices shown]
[im 23/67  brain]
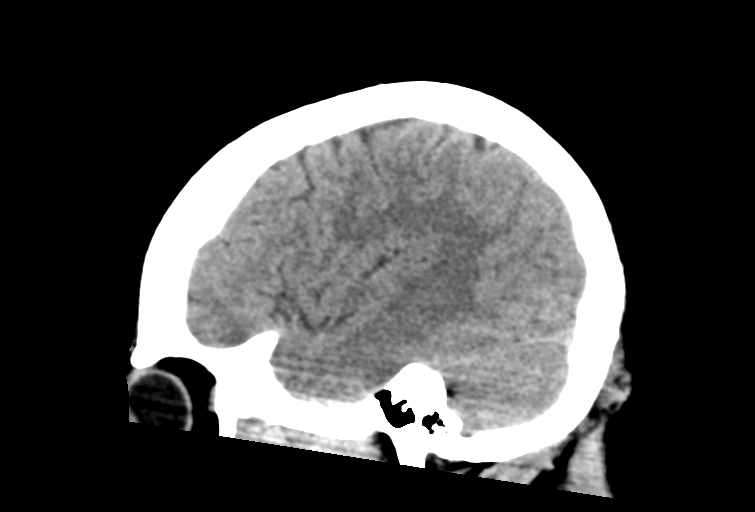
[im 34/67  brain]
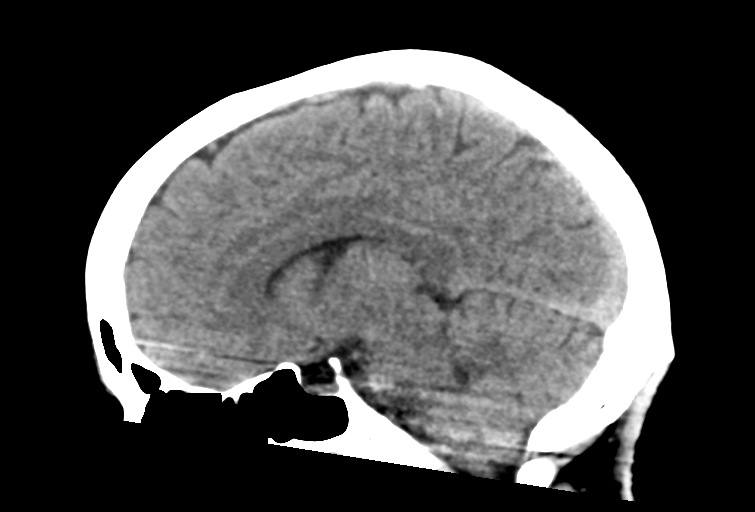
[im 45/67  brain]
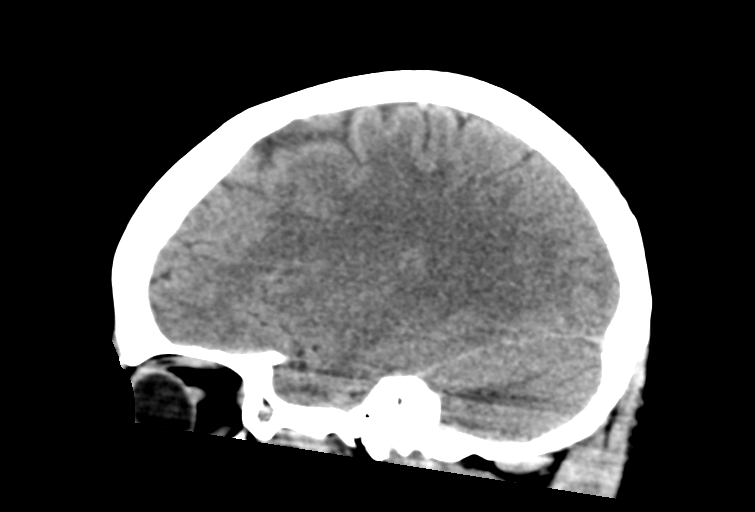

[15 of 47 positions shown; findings below may reference images not displayed]

FINDINGS: Brain: There is no evidence of acute infarct, intracranial
hemorrhage, mass, midline shift, or extra-axial fluid collection.
The ventricles and sulci are normal.

Vascular: No hyperdense vessel.

Skull: No fracture or focal osseous lesion.

Sinuses/Orbits: Visualized paranasal sinuses and mastoid air cells
are clear. Visualized orbits are unremarkable.

Other: None.

ASPECTS (Alberta Stroke Program Early CT Score)

- Ganglionic level infarction (caudate, lentiform nuclei, internal
capsule, insula, M1-M3 cortex): 7

- Supraganglionic infarction (M4-M6 cortex): 3

Total score (0-10 with 10 being normal): 10
IMPRESSION: 1. Negative head CT.
2. ASPECTS is 10.

These results were called by telephone at the time of interpretation
on 03/18/2017 at [DATE] to Dr. All Well, who verbally acknowledged
these results.

## 2021-08-14 ENCOUNTER — Encounter (HOSPITAL_COMMUNITY): Payer: Self-pay

## 2021-08-14 ENCOUNTER — Emergency Department (HOSPITAL_COMMUNITY)
Admission: EM | Admit: 2021-08-14 | Discharge: 2021-08-14 | Disposition: A | Discharge status: Home / Self Care | Payer: Medicaid Other | Attending: Student | Admitting: Student

## 2021-08-14 DIAGNOSIS — K0889 Other specified disorders of teeth and supporting structures: Secondary | ICD-10-CM | POA: Diagnosis present

## 2021-08-14 MED ORDER — HYDROCODONE-ACETAMINOPHEN 5-325 MG PO TABS
2.0000 | ORAL_TABLET | Freq: Once | ORAL | Status: AC
  Administered 2021-08-14: 2 via ORAL
  Filled 2021-08-14: qty 2

## 2021-08-14 MED ORDER — IBUPROFEN 600 MG PO TABS: 600.0000 mg | ORAL_TABLET | Freq: Four times a day (QID) | ORAL | 0 refills | Status: AC | PRN

## 2021-08-14 MED ORDER — LIDOCAINE VISCOUS HCL 2 % MT SOLN
15.0000 mL | Freq: Once | OROMUCOSAL | Status: AC
  Administered 2021-08-14: 15 mL via OROMUCOSAL
  Filled 2021-08-14: qty 15

## 2021-08-14 NOTE — ED Triage Notes (Signed)
Pt states left sided dental pain since 7/29. Pt was prescribed amoxicillin and ibuprofen last weekend at [facility] ER. Pt states pain has been off and on since. Pt has not been able to get in touch with a dentist.

## 2021-08-14 NOTE — Discharge Instructions (Signed)
Please take 600 mg ibuprofen every 6 hours as needed for pain.  You can take Tylenol every 4 hours and stack them like we discussed.  Please follow-up with the health department for dental evaluation or the on-call dentist that I provided today.  Please return to the emergency department for any worsening symptoms you might have.

## 2021-08-14 NOTE — ED Provider Notes (Signed)
[facility] EMERGENCY DEPARTMENT Provider Note   CSN: [id number] Arrival date & time: 08/14/21  1759     History Chief Complaint  Patient presents with   Dental Pain    ILKA LOVICK is a 46 y.o. female patient [facility] to the [facility] apartment today for further evaluation of left-sided dental pain for the last week.  Patient was seen evaluated in [facility] for similar symptoms last week.  She was given ibuprofen in the emergency department and given amoxicillin.  She has been unable to get a hold of her dentist.  Pain is increasing which prompted her arrival today.  No fever or chills.  Currently still taking amoxicillin.  Has not missed any doses.  Has been taking Goody powder, ibuprofen   Dental Pain      Home Medications Prior to Admission medications   Medication Sig Start Date End Date Taking? Authorizing Provider  ibuprofen (ADVIL) 600 MG tablet Take 1 tablet (600 mg total) by mouth every 6 (six) hours as needed. 08/14/21  Yes Velisa Regnier M, PA-C  albuterol (PROVENTIL HFA;VENTOLIN HFA) 108 (90 BASE) MCG/ACT inhaler Inhale 1-2 puffs into the lungs every 6 (six) hours as needed for wheezing or shortness of breath. 05/26/14   Burgess Amor, PA-C  amoxicillin (AMOXIL) 500 MG capsule Take 1 capsule (500 mg total) by mouth 3 (three) times daily. 09/13/19   Hyman Hopes, Margaux, PA-C  ondansetron (ZOFRAN) 4 MG tablet Take 1 tablet (4 mg total) by mouth every 8 (eight) hours as needed for nausea or vomiting. 12/26/15   Samuel Jester, DO  oseltamivir (TAMIFLU) 75 MG capsule Take 1 capsule (75 mg total) by mouth every 12 (twelve) hours. 03/04/18   Dione Booze, MD      Allergies    Patient has no known allergies.    Review of Systems   Review of Systems  All other systems reviewed and are negative.   Physical Exam Updated Vital Signs BP (!) 146/88 (BP Location: Right Arm)   Pulse 85   Temp 98.4 F (36.9 C) (Oral)   Resp 16   Ht 5\' 1"  (1.549 m)   Wt 74 kg   LMP 07/24/2021   SpO2  100%   BMI 30.84 kg/m  Physical Exam Vitals and nursing note reviewed.  Constitutional:      Appearance: Normal appearance.  HENT:     Head: Normocephalic and atraumatic.     Mouth/Throat:     Comments: No obvious fluctuance or swelling noted over the upper or lower left gumline.  No swelling of the oral floor.  Tongue is normal.  Talking in complete sentences.  No posterior pharyngeal erythema or swelling. Eyes:     General:        Right eye: No discharge.        Left eye: No discharge.     Conjunctiva/sclera: Conjunctivae normal.  Pulmonary:     Effort: Pulmonary effort is normal.  Skin:    General: Skin is warm and dry.     Findings: No rash.  Neurological:     General: No focal deficit present.     Mental Status: She is alert.  Psychiatric:        Mood and Affect: Mood normal.        Behavior: Behavior normal.     ED Results / Procedures / Treatments   Labs (all labs ordered are listed, but only abnormal results are displayed) Labs Reviewed - No data to display  EKG None  Radiology No results found.  Procedures Procedures    Medications Ordered in ED Medications  HYDROcodone-acetaminophen (NORCO/VICODIN) 5-325 MG per tablet 2 tablet (2 tablets Oral Given 08/14/21 1857)  lidocaine (XYLOCAINE) 2 % viscous mouth solution 15 mL (15 mLs Mouth/Throat Given 08/14/21 1857)    ED Course/ Medical Decision Making/ A&P Clinical Course as of 08/14/21 1918  Sat Aug 14, 2021  1908 On reevaluation, patient is feeling better after viscous lidocaine solution.  I discussed that she needs to stop taking Goody powders, Excedrin, and ibuprofen as this can mess up her kidneys.  Patient understood. [CF]    Clinical Course User Index [CF] Teressa Lower, PA-C                           Medical Decision Making TORI DATTILIO is a 46 y.o. female patient who presents to the emergency department for further evaluation of dental pain.  I do not see any evidence of Ludwig's angina or  PTA or RPA.  Patient still on amoxicillin.  I will give her Norco and viscous lidocaine for analgesia control and will reassess.  As highlighted in ED course, patient is feeling better after viscous lidocaine.  I educated the patient on taking a lot of NSAIDs at once as this can damage her kidneys.  She expressed full understanding.  She will stop taking these medications and I will write her for 600 mg ibuprofen.  I also instructed her that she can alternate Tylenol as well.  She is currently taking amoxicillin and still has some pills left from that.  I have also given her on-call dentistry.  She will also contact the health department.  Strict return precautions were discussed.  She is safe for discharge at this time.  Risk Prescription drug management.    Final Clinical Impression(s) / ED Diagnoses Final diagnoses:  Pain, dental    Rx / DC Orders ED Discharge Orders          Ordered    ibuprofen (ADVIL) 600 MG tablet  Every 6 hours PRN        08/14/21 1916              Teressa Lower, PA-C 08/14/21 1918    Glendora Score, MD 08/17/21 [phone]
